# Patient Record
Sex: Female | Born: 1935 | Race: White | Hispanic: No
Health system: Southern US, Community
[De-identification: ages and names within clinical notes are randomized; demographics above are authoritative.]

---

## 2004-07-24 ENCOUNTER — Ambulatory Visit: Payer: Self-pay | Admitting: Internal Medicine

## 2005-02-27 ENCOUNTER — Ambulatory Visit: Payer: Self-pay | Admitting: Internal Medicine

## 2006-04-30 ENCOUNTER — Ambulatory Visit: Payer: Self-pay | Admitting: Internal Medicine

## 2007-05-02 ENCOUNTER — Ambulatory Visit: Payer: Self-pay | Admitting: Internal Medicine

## 2007-05-07 ENCOUNTER — Ambulatory Visit: Payer: Self-pay | Admitting: Internal Medicine

## 2007-06-10 ENCOUNTER — Ambulatory Visit: Payer: Self-pay | Admitting: Gastroenterology

## 2007-11-13 ENCOUNTER — Ambulatory Visit: Payer: Self-pay | Admitting: Internal Medicine

## 2007-11-18 ENCOUNTER — Other Ambulatory Visit: Payer: Self-pay

## 2007-11-18 ENCOUNTER — Emergency Department: Payer: Self-pay | Admitting: Emergency Medicine

## 2008-08-23 ENCOUNTER — Ambulatory Visit: Payer: Self-pay | Admitting: Physician Assistant

## 2008-10-06 ENCOUNTER — Ambulatory Visit: Payer: Self-pay | Admitting: Physical Medicine and Rehabilitation

## 2008-11-16 ENCOUNTER — Ambulatory Visit: Payer: Self-pay | Admitting: Internal Medicine

## 2008-11-19 ENCOUNTER — Ambulatory Visit: Payer: Self-pay | Admitting: Internal Medicine

## 2010-10-02 LAB — BASIC METABOLIC PANEL
BUN: 10 mg/dL (ref 6–23)
CO2: 27 mEq/L (ref 19–32)
Calcium: 9.6 mg/dL (ref 8.4–10.5)
Chloride: 94 mEq/L — ABNORMAL LOW (ref 96–112)
Creatinine, Ser: 0.78 mg/dL (ref 0.4–1.2)
GFR calc Af Amer: >60 mL/min (ref >60)
GFR calc non Af Amer: >60 mL/min (ref >60)
Glucose, Bld: 137 mg/dL — ABNORMAL HIGH (ref 70–99)
Potassium: 4.4 mEq/L (ref 3.5–5.1)
Sodium: 129 mEq/L — ABNORMAL LOW (ref 135–145)

## 2010-10-02 LAB — CBC
HCT: 40.6 % (ref 36.0–46.0)
Hemoglobin: 13.8 g/dL (ref 12.0–15.0)
MCH: 30.5 pg (ref 26.0–34.0)
MCHC: 34 g/dL (ref 30.0–36.0)
MCV: 89.6 fL (ref 78.0–100.0)
Platelets: 239 10*3/uL (ref 150–400)
RBC: 4.53 MIL/uL (ref 3.87–5.11)
RDW: 13.3 % (ref 11.5–15.5)
WBC: 9.4 10*3/uL (ref 4.0–10.5)

## 2010-10-02 LAB — SURGICAL PCR SCREEN
MRSA, PCR: NEGATIVE
Staphylococcus aureus: POSITIVE — AB

## 2010-10-05 ENCOUNTER — Inpatient Hospital Stay (HOSPITAL_COMMUNITY)
Admission: RE | Admit: 2010-10-05 | Discharge: 2010-10-06 | Payer: Self-pay | Source: Home / Self Care | Attending: Neurological Surgery | Admitting: Neurological Surgery

## 2010-10-09 LAB — BASIC METABOLIC PANEL
BUN: 14 mg/dL (ref 6–23)
CO2: 29 mEq/L (ref 19–32)
Calcium: 9.9 mg/dL (ref 8.4–10.5)
Chloride: 96 mEq/L (ref 96–112)
Creatinine, Ser: 0.89 mg/dL (ref 0.4–1.2)
GFR calc Af Amer: >60 mL/min (ref >60)
GFR calc non Af Amer: >60 mL/min (ref >60)
Glucose, Bld: 120 mg/dL — ABNORMAL HIGH (ref 70–99)
Potassium: 4.5 mEq/L (ref 3.5–5.1)
Sodium: 136 mEq/L (ref 135–145)

## 2010-11-09 NOTE — Op Note (Signed)
NAMEJANARIA, MCCAMMON NO.:  [id number]  MEDICAL RECORD NO.:  [id number]          PATIENT TYPE:  INP  LOCATION:  3032                         FACILITY:  [facility]  PHYSICIAN:  Stefani Dama, M.D.  DATE OF BIRTH:  [birth year]  DATE OF PROCEDURE:  10/05/2010 DATE OF DISCHARGE:                              OPERATIVE REPORT   PREOPERATIVE DIAGNOSIS:  Lumbar spondylosis and stenosis, L3-4 and L4-5.  POSTOPERATIVE DIAGNOSIS:  Lumbar spondylosis and stenosis, L3-4 and L4- 5.  PROCEDURE:  Bilateral laminotomies, L3-4 and L4-5 with operating microscope, microdissection technique, and foraminotomies for L3 and L4 nerve roots and L5 nerve roots bilaterally.  SURGEON:  Stefani Dama, MD  FIRST ASSISTANT:  Clydene Fake, MD  ANESTHESIA:  General endotracheal.  INDICATIONS:  [name] is an 77 year old individual who has had significant problems with back and bilateral lower extremity pain to the point where she had become essentially immobile and had started to use a wheelchair to get around.  She could walk only very short distances within her house and even this was becoming difficult such that self- care items were becoming difficult.  She was found to have severe spondylitic stenosis at L4-5 and moderately severe stenosis at L3-4. After careful evaluation of her situation, we advised bilateral laminotomies and foraminotomies.  PROCEDURE:  The patient was brought to the operating room supine on the stretcher.  After smooth induction of general endotracheal anesthesia, she was turned prone.  The back was prepped with alcohol and DuraPrep, and draped in sterile fashion.  Midline incision was created in the lower lumbar spine and this was carried down to lumbodorsal fascia, which was opened on either side of the midline.  Spinous processes of L3 and L4 were identified positively and the dissection was carried down to expose the interlaminar spaces at L3-4  and L4-5.  Self-retaining retractor was placed in the wound.  Then, laminotomy was created first on the left side at L4-L5 using a high-speed bur and a 4-mm round bit to remove significant portions of bone and the medial wall of facet. Common dural tube was ultimately able to be identified after removing large portions of severely hypertrophied and redundant yellow ligament. The common dural tube was noted to be severely compressed.  It was gradually decompressed in its lateral extent.  There was a small dural rent on the lateral aspect of the dural tube where it was adherent to the overlying fascial tissue.  The arachnoid was visible, but no spinal fluid leakage was noted.  This area was tamponaded away and then laminotomies were created on the other 3 sites.  On the right side, the L4 nerve root superiorly and the L5 nerve root inferiorly were decompressed removing substantial amount of redundant yellow ligamentous material that appeared to be a cystic mass in the superior aspect of the facet at L4-L5.  The L5 nerve root was easily decompressed and travel out the foramen inferiorly.  At L3-4 on the right side, moderate spinal stenosis was encountered again with substantial amount of redundant yellow ligament in the anterior ligamentous space and this was removed and decompressed  the common dural tube, and the L3 nerve root superiorly and the L4 nerve root inferiorly.  On the left side, then the L3-4 interspace was decompressed by removing the inferior margin lamina out to the medial wall of the facet and the ligamentous material was removed from this area too.  The L4 nerve root was particularly stenotic and it has travel out the foramen.  This was decompressed by performing a generous laminotomy for the L4 nerve root.  L3 nerve root superiorly had ligamentous material trapped in there and there was moderate amount of adherence of the dural tube to some lateral fascial attachments.   These were all decompressed and care was taken to maintain the integrity of the dural tube.  We then returned to the left side at L4-L5.  Here, the dural opening was explored and the laminotomy was widened out removing an even larger portion of the facet joint to get lateral to the dural tear and identify all the aspects of the common dural tube.  When these were secured, I used 6-0 Prolene to individually close the dural tear with figure-of-eight sutures placed in the middle portion and superiorly single sutures being placed to close the dura tightly.  No spinal fluid leakage was had.  In the end, the common dural tube at the L4 nerve root superiorly, the L5 nerve root inferiorly were well decompressed. Hemostasis was achieved meticulously and then the lumbodorsal fascia was closed with #1 Vicryl in interrupted fashion, 2-0 Vicryl in subcutaneous tissues and 3-0 Vicryl subcuticularly.  Dermabond was placed on the skin.  Blood loss for the procedure was estimated at 150 mL.     Stefani Dama, M.D.     Merla Riches  D:  10/05/2010  T:  10/06/2010  Job:  [id number]  Electronically Signed by Barnett Abu M.D. on 11/09/2010 07:54:10 AM

## 2011-12-21 ENCOUNTER — Ambulatory Visit: Payer: Self-pay | Admitting: Otolaryngology

## 2012-01-01 ENCOUNTER — Ambulatory Visit: Payer: Self-pay | Admitting: Otolaryngology

## 2012-01-01 LAB — CREATININE, SERUM
Creatinine: 0.8 mg/dL (ref 0.60–1.30)
EGFR (African American): >60
EGFR (Non-African Amer.): >60

## 2012-01-16 ENCOUNTER — Ambulatory Visit: Payer: Self-pay | Admitting: Otolaryngology

## 2012-01-29 ENCOUNTER — Ambulatory Visit: Payer: Self-pay | Admitting: Oncology

## 2012-01-29 LAB — COMPREHENSIVE METABOLIC PANEL
Albumin: 3.6 g/dL (ref 3.4–5.0)
Alkaline Phosphatase: 495 U/L — ABNORMAL HIGH (ref 50–136)
Anion Gap: 10 (ref 7–16)
BUN: 15 mg/dL (ref 7–18)
Bilirubin,Total: 0.6 mg/dL (ref 0.2–1.0)
Calcium, Total: 9.2 mg/dL (ref 8.5–10.1)
Chloride: 98 mmol/L (ref 98–107)
Co2: 27 mmol/L (ref 21–32)
Creatinine: 0.69 mg/dL (ref 0.60–1.30)
EGFR (African American): >60
EGFR (Non-African Amer.): >60
Glucose: 107 mg/dL — ABNORMAL HIGH (ref 65–99)
Osmolality: 271 (ref 275–301)
Potassium: 3.9 mmol/L (ref 3.5–5.1)
SGOT(AST): 23 U/L (ref 15–37)
SGPT (ALT): 21 U/L
Sodium: 135 mmol/L — ABNORMAL LOW (ref 136–145)
Total Protein: 7.4 g/dL (ref 6.4–8.2)

## 2012-01-29 LAB — CBC CANCER CENTER
Basophil #: 0.1 x10 3/mm (ref 0.0–0.1)
Basophil %: 0.6 %
Eosinophil #: 0.2 x10 3/mm (ref 0.0–0.7)
Eosinophil %: 1.9 %
HCT: 38.7 % (ref 35.0–47.0)
HGB: 12.7 g/dL (ref 12.0–16.0)
Lymphocyte #: 1.7 x10 3/mm (ref 1.0–3.6)
Lymphocyte %: 13.4 %
MCH: 29.1 pg (ref 26.0–34.0)
MCHC: 32.8 g/dL (ref 32.0–36.0)
MCV: 89 fL (ref 80–100)
Monocyte #: 0.7 x10 3/mm (ref 0.2–0.9)
Monocyte %: 5.7 %
Neutrophil #: 10 x10 3/mm — ABNORMAL HIGH (ref 1.4–6.5)
Neutrophil %: 78.4 %
Platelet: 244 x10 3/mm (ref 150–440)
RBC: 4.37 10*6/uL (ref 3.80–5.20)
RDW: 15.2 % — ABNORMAL HIGH (ref 11.5–14.5)
WBC: 12.7 x10 3/mm — ABNORMAL HIGH (ref 3.6–11.0)

## 2012-01-30 LAB — CANCER ANTIGEN 19-9: CA 19-9: 163 U/mL — ABNORMAL HIGH (ref 0–35)

## 2012-01-30 LAB — CANCER ANTIGEN 27.29: CA 27.29: 79.5 U/mL — ABNORMAL HIGH (ref 0.0–38.6)

## 2012-01-30 LAB — CEA: CEA: 5 ng/mL — ABNORMAL HIGH (ref 0.0–4.7)

## 2012-02-16 ENCOUNTER — Ambulatory Visit: Payer: Self-pay | Admitting: Oncology

## 2012-02-26 LAB — CBC CANCER CENTER
Basophil #: 0 x10 3/mm (ref 0.0–0.1)
Basophil %: 0.4 %
Eosinophil #: 0.2 x10 3/mm (ref 0.0–0.7)
Eosinophil %: 2.4 %
HCT: 36.4 % (ref 35.0–47.0)
HGB: 12 g/dL (ref 12.0–16.0)
Lymphocyte #: 1.4 x10 3/mm (ref 1.0–3.6)
Lymphocyte %: 14.4 %
MCH: 29.4 pg (ref 26.0–34.0)
MCHC: 33.1 g/dL (ref 32.0–36.0)
MCV: 89 fL (ref 80–100)
Monocyte #: 0.8 x10 3/mm (ref 0.2–0.9)
Monocyte %: 7.7 %
Neutrophil #: 7.4 x10 3/mm — ABNORMAL HIGH (ref 1.4–6.5)
Neutrophil %: 75.1 %
Platelet: 264 x10 3/mm (ref 150–440)
RBC: 4.09 10*6/uL (ref 3.80–5.20)
RDW: 14.7 % — ABNORMAL HIGH (ref 11.5–14.5)
WBC: 9.9 x10 3/mm (ref 3.6–11.0)

## 2012-02-26 LAB — BASIC METABOLIC PANEL
Anion Gap: 9 (ref 7–16)
BUN: 15 mg/dL (ref 7–18)
Calcium, Total: 9.1 mg/dL (ref 8.5–10.1)
Chloride: 95 mmol/L — ABNORMAL LOW (ref 98–107)
Co2: 27 mmol/L (ref 21–32)
Creatinine: 0.71 mg/dL (ref 0.60–1.30)
EGFR (African American): >60
EGFR (Non-African Amer.): >60
Glucose: 140 mg/dL — ABNORMAL HIGH (ref 65–99)
Osmolality: 266 (ref 275–301)
Potassium: 4.1 mmol/L (ref 3.5–5.1)
Sodium: 131 mmol/L — ABNORMAL LOW (ref 136–145)

## 2012-03-17 ENCOUNTER — Ambulatory Visit: Payer: Self-pay | Admitting: Oncology

## 2012-03-27 LAB — CBC CANCER CENTER
Basophil #: 0.1 x10 3/mm (ref 0.0–0.1)
Basophil %: 1.4 %
Eosinophil #: 1.1 x10 3/mm — ABNORMAL HIGH (ref 0.0–0.7)
Eosinophil %: 19.1 %
HCT: 35.8 % (ref 35.0–47.0)
HGB: 11.8 g/dL — ABNORMAL LOW (ref 12.0–16.0)
Lymphocyte #: 0.8 x10 3/mm — ABNORMAL LOW (ref 1.0–3.6)
Lymphocyte %: 13.2 %
MCH: 29 pg (ref 26.0–34.0)
MCHC: 33.1 g/dL (ref 32.0–36.0)
MCV: 88 fL (ref 80–100)
Monocyte #: 0.8 x10 3/mm (ref 0.2–0.9)
Monocyte %: 13.6 %
Neutrophil #: 3.1 x10 3/mm (ref 1.4–6.5)
Neutrophil %: 52.7 %
Platelet: 328 x10 3/mm (ref 150–440)
RBC: 4.08 10*6/uL (ref 3.80–5.20)
RDW: 14.7 % — ABNORMAL HIGH (ref 11.5–14.5)
WBC: 6 x10 3/mm (ref 3.6–11.0)

## 2012-03-27 LAB — COMPREHENSIVE METABOLIC PANEL
Albumin: 3.1 g/dL — ABNORMAL LOW (ref 3.4–5.0)
Alkaline Phosphatase: 312 U/L — ABNORMAL HIGH (ref 50–136)
Anion Gap: 11 (ref 7–16)
BUN: 14 mg/dL (ref 7–18)
Bilirubin,Total: 0.5 mg/dL (ref 0.2–1.0)
Calcium, Total: 8.2 mg/dL — ABNORMAL LOW (ref 8.5–10.1)
Chloride: 95 mmol/L — ABNORMAL LOW (ref 98–107)
Co2: 25 mmol/L (ref 21–32)
Creatinine: 0.96 mg/dL (ref 0.60–1.30)
EGFR (African American): >60
EGFR (Non-African Amer.): 55 — ABNORMAL LOW
Glucose: 92 mg/dL (ref 65–99)
Osmolality: 263 (ref 275–301)
Potassium: 4 mmol/L (ref 3.5–5.1)
SGOT(AST): 24 U/L (ref 15–37)
SGPT (ALT): 37 U/L
Sodium: 131 mmol/L — ABNORMAL LOW (ref 136–145)
Total Protein: 7 g/dL (ref 6.4–8.2)

## 2012-04-17 ENCOUNTER — Ambulatory Visit: Payer: Self-pay | Admitting: Oncology

## 2012-04-24 LAB — CBC CANCER CENTER
Basophil #: 0 x10 3/mm (ref 0.0–0.1)
Basophil %: 0.2 %
Eosinophil #: 0.7 x10 3/mm (ref 0.0–0.7)
Eosinophil %: 10.6 %
HCT: 37.5 % (ref 35.0–47.0)
HGB: 12.6 g/dL (ref 12.0–16.0)
Lymphocyte #: 1 x10 3/mm (ref 1.0–3.6)
Lymphocyte %: 15.2 %
MCH: 29.6 pg (ref 26.0–34.0)
MCHC: 33.7 g/dL (ref 32.0–36.0)
MCV: 88 fL (ref 80–100)
Monocyte #: 1 x10 3/mm — ABNORMAL HIGH (ref 0.2–0.9)
Monocyte %: 15.5 %
Neutrophil #: 3.8 x10 3/mm (ref 1.4–6.5)
Neutrophil %: 58.5 %
Platelet: 272 x10 3/mm (ref 150–440)
RBC: 4.28 10*6/uL (ref 3.80–5.20)
RDW: 15.9 % — ABNORMAL HIGH (ref 11.5–14.5)
WBC: 6.5 x10 3/mm (ref 3.6–11.0)

## 2012-04-24 LAB — COMPREHENSIVE METABOLIC PANEL
Albumin: 3 g/dL — ABNORMAL LOW (ref 3.4–5.0)
Alkaline Phosphatase: 249 U/L — ABNORMAL HIGH (ref 50–136)
Anion Gap: 7 (ref 7–16)
BUN: 16 mg/dL (ref 7–18)
Bilirubin,Total: 0.7 mg/dL (ref 0.2–1.0)
Calcium, Total: 7.5 mg/dL — ABNORMAL LOW (ref 8.5–10.1)
Chloride: 95 mmol/L — ABNORMAL LOW (ref 98–107)
Co2: 27 mmol/L (ref 21–32)
Creatinine: 1.08 mg/dL (ref 0.60–1.30)
EGFR (African American): 55 — ABNORMAL LOW
EGFR (Non-African Amer.): 47 — ABNORMAL LOW
Glucose: 95 mg/dL (ref 65–99)
Osmolality: 260 (ref 275–301)
Potassium: 3.8 mmol/L (ref 3.5–5.1)
SGOT(AST): 511 U/L — ABNORMAL HIGH (ref 15–37)
SGPT (ALT): 1262 U/L — ABNORMAL HIGH (ref 12–78)
Sodium: 129 mmol/L — ABNORMAL LOW (ref 136–145)
Total Protein: 6.3 g/dL — ABNORMAL LOW (ref 6.4–8.2)

## 2012-05-18 ENCOUNTER — Ambulatory Visit: Payer: Self-pay | Admitting: Oncology

## 2012-05-20 ENCOUNTER — Ambulatory Visit: Payer: Self-pay | Admitting: Oncology

## 2012-05-22 ENCOUNTER — Ambulatory Visit: Payer: Self-pay | Admitting: Oncology

## 2012-05-22 LAB — CBC CANCER CENTER
Basophil #: 0.1 x10 3/mm (ref 0.0–0.1)
Basophil %: 1.1 %
Eosinophil #: 0.4 x10 3/mm (ref 0.0–0.7)
Eosinophil %: 9 %
HCT: 37.8 % (ref 35.0–47.0)
HGB: 12.2 g/dL (ref 12.0–16.0)
Lymphocyte #: 1.6 x10 3/mm (ref 1.0–3.6)
Lymphocyte %: 35.3 %
MCH: 29 pg (ref 26.0–34.0)
MCHC: 32.4 g/dL (ref 32.0–36.0)
MCV: 90 fL (ref 80–100)
Monocyte #: 0.9 x10 3/mm (ref 0.2–0.9)
Monocyte %: 19.6 %
Neutrophil #: 1.6 x10 3/mm (ref 1.4–6.5)
Neutrophil %: 35 %
Platelet: 184 x10 3/mm (ref 150–440)
RBC: 4.22 10*6/uL (ref 3.80–5.20)
RDW: 17.8 % — ABNORMAL HIGH (ref 11.5–14.5)
WBC: 4.6 x10 3/mm (ref 3.6–11.0)

## 2012-05-22 LAB — COMPREHENSIVE METABOLIC PANEL
Albumin: 2.9 g/dL — ABNORMAL LOW (ref 3.4–5.0)
Alkaline Phosphatase: 194 U/L — ABNORMAL HIGH (ref 50–136)
Anion Gap: 3 — ABNORMAL LOW (ref 7–16)
BUN: 11 mg/dL (ref 7–18)
Bilirubin,Total: 0.7 mg/dL (ref 0.2–1.0)
Calcium, Total: 7.8 mg/dL — ABNORMAL LOW (ref 8.5–10.1)
Chloride: 97 mmol/L — ABNORMAL LOW (ref 98–107)
Co2: 30 mmol/L (ref 21–32)
Creatinine: 0.96 mg/dL (ref 0.60–1.30)
EGFR (African American): >60
EGFR (Non-African Amer.): 54 — ABNORMAL LOW
Glucose: 79 mg/dL (ref 65–99)
Osmolality: 259 (ref 275–301)
Potassium: 4 mmol/L (ref 3.5–5.1)
SGOT(AST): 69 U/L — ABNORMAL HIGH (ref 15–37)
SGPT (ALT): 159 U/L — ABNORMAL HIGH (ref 12–78)
Sodium: 130 mmol/L — ABNORMAL LOW (ref 136–145)
Total Protein: 6.2 g/dL — ABNORMAL LOW (ref 6.4–8.2)

## 2012-06-17 ENCOUNTER — Ambulatory Visit: Payer: Self-pay | Admitting: Oncology

## 2012-06-19 LAB — BASIC METABOLIC PANEL
Anion Gap: 7 (ref 7–16)
BUN: 18 mg/dL (ref 7–18)
Calcium, Total: 7.8 mg/dL — ABNORMAL LOW (ref 8.5–10.1)
Chloride: 101 mmol/L (ref 98–107)
Co2: 26 mmol/L (ref 21–32)
Creatinine: 1.02 mg/dL (ref 0.60–1.30)
EGFR (African American): 59 — ABNORMAL LOW
EGFR (Non-African Amer.): 50 — ABNORMAL LOW
Glucose: 97 mg/dL (ref 65–99)
Osmolality: 270 (ref 275–301)
Potassium: 4.3 mmol/L (ref 3.5–5.1)
Sodium: 134 mmol/L — ABNORMAL LOW (ref 136–145)

## 2012-06-19 LAB — CBC CANCER CENTER
Basophil #: 0 x10 3/mm (ref 0.0–0.1)
Basophil %: 0.9 %
Eosinophil #: 0.3 x10 3/mm (ref 0.0–0.7)
Eosinophil %: 7.7 %
HCT: 38.4 % (ref 35.0–47.0)
HGB: 12.5 g/dL (ref 12.0–16.0)
Lymphocyte #: 1.3 x10 3/mm (ref 1.0–3.6)
Lymphocyte %: 31.4 %
MCH: 29.9 pg (ref 26.0–34.0)
MCHC: 32.6 g/dL (ref 32.0–36.0)
MCV: 92 fL (ref 80–100)
Monocyte #: 0.8 x10 3/mm (ref 0.2–0.9)
Monocyte %: 19 %
Neutrophil #: 1.7 x10 3/mm (ref 1.4–6.5)
Neutrophil %: 41 %
Platelet: 167 x10 3/mm (ref 150–440)
RBC: 4.19 10*6/uL (ref 3.80–5.20)
RDW: 19.4 % — ABNORMAL HIGH (ref 11.5–14.5)
WBC: 4.2 x10 3/mm (ref 3.6–11.0)

## 2012-07-17 LAB — COMPREHENSIVE METABOLIC PANEL
Albumin: 2.6 g/dL — ABNORMAL LOW (ref 3.4–5.0)
Alkaline Phosphatase: 115 U/L (ref 50–136)
Anion Gap: 9 (ref 7–16)
BUN: 13 mg/dL (ref 7–18)
Bilirubin,Total: 0.7 mg/dL (ref 0.2–1.0)
Calcium, Total: 7.6 mg/dL — ABNORMAL LOW (ref 8.5–10.1)
Chloride: 98 mmol/L (ref 98–107)
Co2: 25 mmol/L (ref 21–32)
Creatinine: 0.97 mg/dL (ref 0.60–1.30)
EGFR (African American): >60
EGFR (Non-African Amer.): 54 — ABNORMAL LOW
Glucose: 95 mg/dL (ref 65–99)
Osmolality: 264 (ref 275–301)
Potassium: 3.5 mmol/L (ref 3.5–5.1)
SGOT(AST): 43 U/L — ABNORMAL HIGH (ref 15–37)
SGPT (ALT): 58 U/L (ref 12–78)
Sodium: 132 mmol/L — ABNORMAL LOW (ref 136–145)
Total Protein: 5.6 g/dL — ABNORMAL LOW (ref 6.4–8.2)

## 2012-07-17 LAB — CBC CANCER CENTER
Basophil #: 0 x10 3/mm (ref 0.0–0.1)
Basophil %: 0.3 %
Eosinophil #: 0.3 x10 3/mm (ref 0.0–0.7)
Eosinophil %: 7.2 %
HCT: 37.4 % (ref 35.0–47.0)
HGB: 12.4 g/dL (ref 12.0–16.0)
Lymphocyte #: 1.4 x10 3/mm (ref 1.0–3.6)
Lymphocyte %: 33.4 %
MCH: 31.3 pg (ref 26.0–34.0)
MCHC: 33.2 g/dL (ref 32.0–36.0)
MCV: 94 fL (ref 80–100)
Monocyte #: 0.8 x10 3/mm (ref 0.2–0.9)
Monocyte %: 18.9 %
Neutrophil #: 1.7 x10 3/mm (ref 1.4–6.5)
Neutrophil %: 40.2 %
Platelet: 142 x10 3/mm — ABNORMAL LOW (ref 150–440)
RBC: 3.96 10*6/uL (ref 3.80–5.20)
RDW: 17.9 % — ABNORMAL HIGH (ref 11.5–14.5)
WBC: 4.2 x10 3/mm (ref 3.6–11.0)

## 2012-07-18 ENCOUNTER — Ambulatory Visit: Payer: Self-pay | Admitting: Oncology

## 2012-08-13 LAB — CBC CANCER CENTER
Basophil #: 0 x10 3/mm (ref 0.0–0.1)
Basophil %: 0.9 %
Eosinophil #: 0.2 x10 3/mm (ref 0.0–0.7)
Eosinophil %: 5.7 %
HCT: 37.1 % (ref 35.0–47.0)
HGB: 12.5 g/dL (ref 12.0–16.0)
Lymphocyte #: 1.3 x10 3/mm (ref 1.0–3.6)
Lymphocyte %: 30.9 %
MCH: 32.1 pg (ref 26.0–34.0)
MCHC: 33.7 g/dL (ref 32.0–36.0)
MCV: 95 fL (ref 80–100)
Monocyte #: 0.5 x10 3/mm (ref 0.2–0.9)
Monocyte %: 12.4 %
Neutrophil #: 2.1 x10 3/mm (ref 1.4–6.5)
Neutrophil %: 50.1 %
Platelet: 156 x10 3/mm (ref 150–440)
RBC: 3.89 10*6/uL (ref 3.80–5.20)
RDW: 14.8 % — ABNORMAL HIGH (ref 11.5–14.5)
WBC: 4.3 x10 3/mm (ref 3.6–11.0)

## 2012-08-13 LAB — COMPREHENSIVE METABOLIC PANEL
Albumin: 2.8 g/dL — ABNORMAL LOW (ref 3.4–5.0)
Alkaline Phosphatase: 116 U/L (ref 50–136)
Anion Gap: 8 (ref 7–16)
BUN: 14 mg/dL (ref 7–18)
Bilirubin,Total: 0.7 mg/dL (ref 0.2–1.0)
Calcium, Total: 7.7 mg/dL — ABNORMAL LOW (ref 8.5–10.1)
Chloride: 98 mmol/L (ref 98–107)
Co2: 26 mmol/L (ref 21–32)
Creatinine: 1.04 mg/dL (ref 0.60–1.30)
EGFR (African American): 57 — ABNORMAL LOW
EGFR (Non-African Amer.): 49 — ABNORMAL LOW
Glucose: 117 mg/dL — ABNORMAL HIGH (ref 65–99)
Osmolality: 266 (ref 275–301)
Potassium: 3.5 mmol/L (ref 3.5–5.1)
SGOT(AST): 39 U/L — ABNORMAL HIGH (ref 15–37)
SGPT (ALT): 54 U/L (ref 12–78)
Sodium: 132 mmol/L — ABNORMAL LOW (ref 136–145)
Total Protein: 5.6 g/dL — ABNORMAL LOW (ref 6.4–8.2)

## 2012-08-17 ENCOUNTER — Ambulatory Visit: Payer: Self-pay | Admitting: Oncology

## 2012-09-18 ENCOUNTER — Ambulatory Visit: Payer: Self-pay | Admitting: Oncology

## 2012-09-18 LAB — COMPREHENSIVE METABOLIC PANEL
Albumin: 2.8 g/dL — ABNORMAL LOW (ref 3.4–5.0)
Alkaline Phosphatase: 134 U/L (ref 50–136)
Anion Gap: 6 — ABNORMAL LOW (ref 7–16)
BUN: 15 mg/dL (ref 7–18)
Bilirubin,Total: 0.6 mg/dL (ref 0.2–1.0)
Calcium, Total: 8.2 mg/dL — ABNORMAL LOW (ref 8.5–10.1)
Chloride: 99 mmol/L (ref 98–107)
Co2: 27 mmol/L (ref 21–32)
Creatinine: 0.96 mg/dL (ref 0.60–1.30)
EGFR (African American): >60
EGFR (Non-African Amer.): 54 — ABNORMAL LOW
Glucose: 106 mg/dL — ABNORMAL HIGH (ref 65–99)
Osmolality: 266 (ref 275–301)
Potassium: 4.2 mmol/L (ref 3.5–5.1)
SGOT(AST): 35 U/L (ref 15–37)
SGPT (ALT): 48 U/L (ref 12–78)
Sodium: 132 mmol/L — ABNORMAL LOW (ref 136–145)
Total Protein: 6.1 g/dL — ABNORMAL LOW (ref 6.4–8.2)

## 2012-09-18 LAB — CBC CANCER CENTER
Basophil #: 0 x10 3/mm (ref 0.0–0.1)
Basophil %: 0.8 %
Eosinophil #: 0.3 x10 3/mm (ref 0.0–0.7)
Eosinophil %: 5.2 %
HCT: 37.2 % (ref 35.0–47.0)
HGB: 12.7 g/dL (ref 12.0–16.0)
Lymphocyte #: 1.4 x10 3/mm (ref 1.0–3.6)
Lymphocyte %: 27.1 %
MCH: 33.4 pg (ref 26.0–34.0)
MCHC: 34.1 g/dL (ref 32.0–36.0)
MCV: 98 fL (ref 80–100)
Monocyte #: 0.7 x10 3/mm (ref 0.2–0.9)
Monocyte %: 13.7 %
Neutrophil #: 2.8 x10 3/mm (ref 1.4–6.5)
Neutrophil %: 53.2 %
Platelet: 162 x10 3/mm (ref 150–440)
RBC: 3.79 10*6/uL — ABNORMAL LOW (ref 3.80–5.20)
RDW: 14 % (ref 11.5–14.5)
WBC: 5.3 x10 3/mm (ref 3.6–11.0)

## 2012-10-16 LAB — CBC CANCER CENTER
Basophil #: 0 x10 3/mm (ref 0.0–0.1)
Basophil %: 0.7 %
Eosinophil #: 0.2 x10 3/mm (ref 0.0–0.7)
Eosinophil %: 3.6 %
HCT: 38.2 % (ref 35.0–47.0)
HGB: 13 g/dL (ref 12.0–16.0)
Lymphocyte #: 1.2 x10 3/mm (ref 1.0–3.6)
Lymphocyte %: 22.8 %
MCH: 33.2 pg (ref 26.0–34.0)
MCHC: 34.1 g/dL (ref 32.0–36.0)
MCV: 97 fL (ref 80–100)
Monocyte #: 0.6 x10 3/mm (ref 0.2–0.9)
Monocyte %: 11 %
Neutrophil #: 3.1 x10 3/mm (ref 1.4–6.5)
Neutrophil %: 61.9 %
Platelet: 155 x10 3/mm (ref 150–440)
RBC: 3.92 10*6/uL (ref 3.80–5.20)
RDW: 14.2 % (ref 11.5–14.5)
WBC: 5.1 x10 3/mm (ref 3.6–11.0)

## 2012-10-16 LAB — COMPREHENSIVE METABOLIC PANEL
Albumin: 2.9 g/dL — ABNORMAL LOW (ref 3.4–5.0)
Alkaline Phosphatase: 120 U/L (ref 50–136)
Anion Gap: 7 (ref 7–16)
BUN: 13 mg/dL (ref 7–18)
Bilirubin,Total: 0.5 mg/dL (ref 0.2–1.0)
Calcium, Total: 7.9 mg/dL — ABNORMAL LOW (ref 8.5–10.1)
Chloride: 98 mmol/L (ref 98–107)
Co2: 29 mmol/L (ref 21–32)
Creatinine: 1.02 mg/dL (ref 0.60–1.30)
EGFR (African American): 59 — ABNORMAL LOW
EGFR (Non-African Amer.): 50 — ABNORMAL LOW
Glucose: 109 mg/dL — ABNORMAL HIGH (ref 65–99)
Osmolality: 269 (ref 275–301)
Potassium: 3.9 mmol/L (ref 3.5–5.1)
SGOT(AST): 33 U/L (ref 15–37)
SGPT (ALT): 44 U/L (ref 12–78)
Sodium: 134 mmol/L — ABNORMAL LOW (ref 136–145)
Total Protein: 5.9 g/dL — ABNORMAL LOW (ref 6.4–8.2)

## 2012-10-18 ENCOUNTER — Ambulatory Visit: Payer: Self-pay | Admitting: Oncology

## 2012-11-15 ENCOUNTER — Ambulatory Visit: Payer: Self-pay | Admitting: Oncology

## 2012-11-20 LAB — COMPREHENSIVE METABOLIC PANEL
Albumin: 3 g/dL — ABNORMAL LOW (ref 3.4–5.0)
Alkaline Phosphatase: 118 U/L (ref 50–136)
Anion Gap: 9 (ref 7–16)
BUN: 14 mg/dL (ref 7–18)
Bilirubin,Total: 0.7 mg/dL (ref 0.2–1.0)
Calcium, Total: 8.2 mg/dL — ABNORMAL LOW (ref 8.5–10.1)
Chloride: 95 mmol/L — ABNORMAL LOW (ref 98–107)
Co2: 29 mmol/L (ref 21–32)
Creatinine: 1.14 mg/dL (ref 0.60–1.30)
EGFR (African American): 51 — ABNORMAL LOW
EGFR (Non-African Amer.): 44 — ABNORMAL LOW
Glucose: 104 mg/dL — ABNORMAL HIGH (ref 65–99)
Osmolality: 267 (ref 275–301)
Potassium: 3.7 mmol/L (ref 3.5–5.1)
SGOT(AST): 34 U/L (ref 15–37)
SGPT (ALT): 40 U/L (ref 12–78)
Sodium: 133 mmol/L — ABNORMAL LOW (ref 136–145)
Total Protein: 6.2 g/dL — ABNORMAL LOW (ref 6.4–8.2)

## 2012-11-20 LAB — CBC CANCER CENTER
Basophil #: 0.1 x10 3/mm (ref 0.0–0.1)
Basophil %: 0.9 %
Eosinophil #: 0.3 x10 3/mm (ref 0.0–0.7)
Eosinophil %: 4.8 %
HCT: 39.3 % (ref 35.0–47.0)
HGB: 13.3 g/dL (ref 12.0–16.0)
Lymphocyte #: 1.6 x10 3/mm (ref 1.0–3.6)
Lymphocyte %: 25.4 %
MCH: 32.7 pg (ref 26.0–34.0)
MCHC: 33.9 g/dL (ref 32.0–36.0)
MCV: 97 fL (ref 80–100)
Monocyte #: 0.7 x10 3/mm (ref 0.2–0.9)
Monocyte %: 10.8 %
Neutrophil #: 3.8 x10 3/mm (ref 1.4–6.5)
Neutrophil %: 58.1 %
Platelet: 156 x10 3/mm (ref 150–440)
RBC: 4.07 10*6/uL (ref 3.80–5.20)
RDW: 13.7 % (ref 11.5–14.5)
WBC: 6.5 x10 3/mm (ref 3.6–11.0)

## 2012-12-16 ENCOUNTER — Ambulatory Visit: Payer: Self-pay | Admitting: Oncology

## 2012-12-18 LAB — CBC CANCER CENTER
Basophil #: 0 x10 3/mm (ref 0.0–0.1)
Basophil %: 0.8 %
Eosinophil #: 0.2 x10 3/mm (ref 0.0–0.7)
Eosinophil %: 6.1 %
HCT: 38.2 % (ref 35.0–47.0)
HGB: 12.7 g/dL (ref 12.0–16.0)
Lymphocyte #: 0.8 x10 3/mm — ABNORMAL LOW (ref 1.0–3.6)
Lymphocyte %: 21.2 %
MCH: 31.9 pg (ref 26.0–34.0)
MCHC: 33.3 g/dL (ref 32.0–36.0)
MCV: 96 fL (ref 80–100)
Monocyte #: 0.6 x10 3/mm (ref 0.2–0.9)
Monocyte %: 14.5 %
Neutrophil #: 2.3 x10 3/mm (ref 1.4–6.5)
Neutrophil %: 57.4 %
Platelet: 167 x10 3/mm (ref 150–440)
RBC: 3.98 10*6/uL (ref 3.80–5.20)
RDW: 13.9 % (ref 11.5–14.5)
WBC: 3.9 x10 3/mm (ref 3.6–11.0)

## 2012-12-18 LAB — COMPREHENSIVE METABOLIC PANEL
Albumin: 2.8 g/dL — ABNORMAL LOW (ref 3.4–5.0)
Alkaline Phosphatase: 107 U/L (ref 50–136)
Anion Gap: 8 (ref 7–16)
BUN: 11 mg/dL (ref 7–18)
Bilirubin,Total: 0.5 mg/dL (ref 0.2–1.0)
Calcium, Total: 8.3 mg/dL — ABNORMAL LOW (ref 8.5–10.1)
Chloride: 96 mmol/L — ABNORMAL LOW (ref 98–107)
Co2: 29 mmol/L (ref 21–32)
Creatinine: 1.01 mg/dL (ref 0.60–1.30)
EGFR (African American): 59 — ABNORMAL LOW
EGFR (Non-African Amer.): 51 — ABNORMAL LOW
Glucose: 85 mg/dL (ref 65–99)
Osmolality: 265 (ref 275–301)
Potassium: 3.8 mmol/L (ref 3.5–5.1)
SGOT(AST): 28 U/L (ref 15–37)
SGPT (ALT): 37 U/L (ref 12–78)
Sodium: 133 mmol/L — ABNORMAL LOW (ref 136–145)
Total Protein: 6.1 g/dL — ABNORMAL LOW (ref 6.4–8.2)

## 2012-12-29 LAB — COMPREHENSIVE METABOLIC PANEL
Albumin: 2.8 g/dL — ABNORMAL LOW (ref 3.4–5.0)
Alkaline Phosphatase: 117 U/L (ref 50–136)
Anion Gap: 4 — ABNORMAL LOW (ref 7–16)
BUN: 12 mg/dL (ref 7–18)
Bilirubin,Total: 0.6 mg/dL (ref 0.2–1.0)
Calcium, Total: 8.1 mg/dL — ABNORMAL LOW (ref 8.5–10.1)
Chloride: 88 mmol/L — ABNORMAL LOW (ref 98–107)
Co2: 30 mmol/L (ref 21–32)
Creatinine: 1.05 mg/dL (ref 0.60–1.30)
EGFR (African American): 56 — ABNORMAL LOW
EGFR (Non-African Amer.): 49 — ABNORMAL LOW
Glucose: 133 mg/dL — ABNORMAL HIGH (ref 65–99)
Osmolality: 248 (ref 275–301)
Potassium: 4.1 mmol/L (ref 3.5–5.1)
SGOT(AST): 38 U/L — ABNORMAL HIGH (ref 15–37)
SGPT (ALT): 37 U/L (ref 12–78)
Sodium: 122 mmol/L — ABNORMAL LOW (ref 136–145)
Total Protein: 5.7 g/dL — ABNORMAL LOW (ref 6.4–8.2)

## 2012-12-29 LAB — CBC CANCER CENTER
Basophil #: 0 x10 3/mm (ref 0.0–0.1)
Basophil %: 0.5 %
Eosinophil #: 0.2 x10 3/mm (ref 0.0–0.7)
Eosinophil %: 2.1 %
HCT: 35.6 % (ref 35.0–47.0)
HGB: 12.3 g/dL (ref 12.0–16.0)
Lymphocyte #: 0.8 x10 3/mm — ABNORMAL LOW (ref 1.0–3.6)
Lymphocyte %: 8.4 %
MCH: 32.4 pg (ref 26.0–34.0)
MCHC: 34.5 g/dL (ref 32.0–36.0)
MCV: 94 fL (ref 80–100)
Monocyte #: 0.6 x10 3/mm (ref 0.2–0.9)
Monocyte %: 6.9 %
Neutrophil #: 7.5 x10 3/mm — ABNORMAL HIGH (ref 1.4–6.5)
Neutrophil %: 82.1 %
Platelet: 215 x10 3/mm (ref 150–440)
RBC: 3.78 10*6/uL — ABNORMAL LOW (ref 3.80–5.20)
RDW: 14.1 % (ref 11.5–14.5)
WBC: 9.1 x10 3/mm (ref 3.6–11.0)

## 2013-01-12 ENCOUNTER — Ambulatory Visit: Payer: Self-pay | Admitting: Oncology

## 2013-01-15 ENCOUNTER — Ambulatory Visit: Payer: Self-pay | Admitting: Oncology

## 2013-01-15 LAB — MAGNESIUM: Magnesium: 1.8 mg/dL

## 2013-01-15 LAB — CBC CANCER CENTER
Basophil #: 0 x10 3/mm (ref 0.0–0.1)
Basophil %: 0.2 %
Eosinophil #: 0.2 x10 3/mm (ref 0.0–0.7)
Eosinophil %: 4.3 %
HCT: 39.1 % (ref 35.0–47.0)
HGB: 12.9 g/dL (ref 12.0–16.0)
Lymphocyte #: 1.1 x10 3/mm (ref 1.0–3.6)
Lymphocyte %: 20.4 %
MCH: 31.3 pg (ref 26.0–34.0)
MCHC: 33 g/dL (ref 32.0–36.0)
MCV: 95 fL (ref 80–100)
Monocyte #: 0.7 x10 3/mm (ref 0.2–0.9)
Monocyte %: 13.2 %
Neutrophil #: 3.2 x10 3/mm (ref 1.4–6.5)
Neutrophil %: 61.9 %
Platelet: 224 x10 3/mm (ref 150–440)
RBC: 4.12 10*6/uL (ref 3.80–5.20)
RDW: 14.3 % (ref 11.5–14.5)
WBC: 5.2 x10 3/mm (ref 3.6–11.0)

## 2013-01-15 LAB — COMPREHENSIVE METABOLIC PANEL
Albumin: 2.7 g/dL — ABNORMAL LOW (ref 3.4–5.0)
Alkaline Phosphatase: 170 U/L — ABNORMAL HIGH (ref 50–136)
Anion Gap: 9 (ref 7–16)
BUN: 14 mg/dL (ref 7–18)
Bilirubin,Total: 0.8 mg/dL (ref 0.2–1.0)
Calcium, Total: 8.4 mg/dL — ABNORMAL LOW (ref 8.5–10.1)
Chloride: 95 mmol/L — ABNORMAL LOW (ref 98–107)
Co2: 28 mmol/L (ref 21–32)
Creatinine: 1.12 mg/dL (ref 0.60–1.30)
EGFR (African American): 52 — ABNORMAL LOW
EGFR (Non-African Amer.): 45 — ABNORMAL LOW
Glucose: 107 mg/dL — ABNORMAL HIGH (ref 65–99)
Osmolality: 265 (ref 275–301)
Potassium: 3.9 mmol/L (ref 3.5–5.1)
SGOT(AST): 84 U/L — ABNORMAL HIGH (ref 15–37)
SGPT (ALT): 99 U/L — ABNORMAL HIGH (ref 12–78)
Sodium: 132 mmol/L — ABNORMAL LOW (ref 136–145)
Total Protein: 6 g/dL — ABNORMAL LOW (ref 6.4–8.2)

## 2013-01-26 ENCOUNTER — Inpatient Hospital Stay: Payer: Self-pay | Admitting: Oncology

## 2013-01-26 LAB — COMPREHENSIVE METABOLIC PANEL
Albumin: 2.4 g/dL — ABNORMAL LOW (ref 3.4–5.0)
Alkaline Phosphatase: 332 U/L — ABNORMAL HIGH (ref 50–136)
Anion Gap: 10 (ref 7–16)
BUN: 17 mg/dL (ref 7–18)
Bilirubin,Total: 16.8 mg/dL — ABNORMAL HIGH (ref 0.2–1.0)
Calcium, Total: 7.9 mg/dL — ABNORMAL LOW (ref 8.5–10.1)
Chloride: 72 mmol/L — ABNORMAL LOW (ref 98–107)
Co2: 24 mmol/L (ref 21–32)
Creatinine: 0.67 mg/dL (ref 0.60–1.30)
EGFR (African American): >60
EGFR (Non-African Amer.): >60
Glucose: 107 mg/dL — ABNORMAL HIGH (ref 65–99)
Osmolality: 218 (ref 275–301)
Potassium: 4.1 mmol/L (ref 3.5–5.1)
SGOT(AST): 79 U/L — ABNORMAL HIGH (ref 15–37)
SGPT (ALT): 139 U/L — ABNORMAL HIGH (ref 12–78)
Sodium: 106 mmol/L — CL (ref 136–145)
Total Protein: 5.5 g/dL — ABNORMAL LOW (ref 6.4–8.2)

## 2013-01-26 LAB — CBC CANCER CENTER
Basophil #: 0 x10 3/mm (ref 0.0–0.1)
Basophil %: 0.2 %
Eosinophil #: 0 x10 3/mm (ref 0.0–0.7)
Eosinophil %: 0.4 %
HCT: 35.5 % (ref 35.0–47.0)
HGB: 12.5 g/dL (ref 12.0–16.0)
Lymphocyte #: 0.5 x10 3/mm — ABNORMAL LOW (ref 1.0–3.6)
Lymphocyte %: 4.4 %
MCH: 32.3 pg (ref 26.0–34.0)
MCHC: 35.3 g/dL (ref 32.0–36.0)
MCV: 92 fL (ref 80–100)
Monocyte #: 1 x10 3/mm — ABNORMAL HIGH (ref 0.2–0.9)
Monocyte %: 9 %
Neutrophil #: 9.6 x10 3/mm — ABNORMAL HIGH (ref 1.4–6.5)
Neutrophil %: 86 %
Platelet: 208 x10 3/mm (ref 150–440)
RBC: 3.88 10*6/uL (ref 3.80–5.20)
RDW: 14.4 % (ref 11.5–14.5)
WBC: 11.1 x10 3/mm — ABNORMAL HIGH (ref 3.6–11.0)

## 2013-01-26 LAB — URINALYSIS, COMPLETE
Bacteria: NONE SEEN
Glucose,UR: NEGATIVE mg/dL (ref 0–75)
Hyaline Cast: 5
Ketone: NEGATIVE
Nitrite: NEGATIVE
Ph: 5 (ref 4.5–8.0)
Protein: 30
RBC,UR: 3 /HPF (ref 0–5)
Specific Gravity: 1.02 (ref 1.003–1.030)
Squamous Epithelial: 2
WBC UR: 4 /HPF (ref 0–5)

## 2013-01-26 LAB — LACTATE DEHYDROGENASE: LDH: 306 U/L — ABNORMAL HIGH (ref 81–246)

## 2013-01-27 LAB — CBC WITH DIFFERENTIAL/PLATELET
Basophil #: 0 10*3/uL (ref 0.0–0.1)
Basophil %: 0.4 %
Eosinophil #: 0.1 10*3/uL (ref 0.0–0.7)
Eosinophil %: 1.1 %
HCT: 31.2 % — ABNORMAL LOW (ref 35.0–47.0)
HGB: 11.3 g/dL — ABNORMAL LOW (ref 12.0–16.0)
Lymphocyte #: 0.5 10*3/uL — ABNORMAL LOW (ref 1.0–3.6)
Lymphocyte %: 5.7 %
MCH: 32.9 pg (ref 26.0–34.0)
MCHC: 36.4 g/dL — ABNORMAL HIGH (ref 32.0–36.0)
MCV: 91 fL (ref 80–100)
Monocyte #: 0.8 x10 3/mm (ref 0.2–0.9)
Monocyte %: 9.2 %
Neutrophil #: 7.1 10*3/uL — ABNORMAL HIGH (ref 1.4–6.5)
Neutrophil %: 83.6 %
Platelet: 153 10*3/uL (ref 150–440)
RBC: 3.44 10*6/uL — ABNORMAL LOW (ref 3.80–5.20)
RDW: 14.4 % (ref 11.5–14.5)
WBC: 8.5 10*3/uL (ref 3.6–11.0)

## 2013-01-27 LAB — COMPREHENSIVE METABOLIC PANEL
Albumin: 2 g/dL — ABNORMAL LOW (ref 3.4–5.0)
BUN: 15 mg/dL (ref 7–18)
Bilirubin,Total: 16.2 mg/dL — ABNORMAL HIGH (ref 0.2–1.0)
Calcium, Total: 7.1 mg/dL — ABNORMAL LOW (ref 8.5–10.1)
Chloride: 75 mmol/L — ABNORMAL LOW (ref 98–107)
Co2: 27 mmol/L (ref 21–32)
EGFR (Non-African Amer.): >60
Glucose: 77 mg/dL (ref 65–99)
Potassium: 3.6 mmol/L (ref 3.5–5.1)
SGOT(AST): 75 U/L — ABNORMAL HIGH (ref 15–37)

## 2013-01-28 LAB — CBC WITH DIFFERENTIAL/PLATELET
Eosinophil #: 0 10*3/uL (ref 0.0–0.7)
HGB: 11.1 g/dL — ABNORMAL LOW (ref 12.0–16.0)
Lymphocyte #: 0.5 10*3/uL — ABNORMAL LOW (ref 1.0–3.6)
Lymphocyte %: 5.8 %
MCH: 33.1 pg (ref 26.0–34.0)
Monocyte #: 0.6 x10 3/mm (ref 0.2–0.9)
Monocyte %: 6.7 %
Neutrophil #: 7.8 10*3/uL — ABNORMAL HIGH (ref 1.4–6.5)
Neutrophil %: 86.5 %
RBC: 3.36 10*6/uL — ABNORMAL LOW (ref 3.80–5.20)

## 2013-01-28 LAB — COMPREHENSIVE METABOLIC PANEL
Alkaline Phosphatase: 353 U/L — ABNORMAL HIGH (ref 50–136)
Anion Gap: 5 — ABNORMAL LOW (ref 7–16)
BUN: 16 mg/dL (ref 7–18)
Calcium, Total: 6.6 mg/dL — CL (ref 8.5–10.1)
Co2: 24 mmol/L (ref 21–32)
Creatinine: 0.61 mg/dL (ref 0.60–1.30)
EGFR (African American): >60
EGFR (Non-African Amer.): >60
Osmolality: 232 (ref 275–301)
Potassium: 3.4 mmol/L — ABNORMAL LOW (ref 3.5–5.1)
SGPT (ALT): 106 U/L — ABNORMAL HIGH (ref 12–78)
Sodium: 114 mmol/L — CL (ref 136–145)
Total Protein: 4.5 g/dL — ABNORMAL LOW (ref 6.4–8.2)

## 2013-01-28 LAB — URINE CULTURE

## 2013-02-15 ENCOUNTER — Ambulatory Visit: Payer: Self-pay | Admitting: Oncology

## 2013-02-15 DEATH — deceased

## 2014-03-04 IMAGING — CT CT NECK WITH CONTRAST
2 series · 10 of 14 positions shown, 12 images · IV contrast (agent unspecified)
Comparison: none

REASON FOR EXAM: US done 12-21-11 internal blood flow in area of rt neck mass
will have labs'42.
COMMENTS:

PROCEDURE:     CT NECK WITH CONTRAST 01/01/2012 [DATE]
RESULT:
TECHNIQUE: Helical 3 mm sections were obtained from the skull base through
the mid mediastinal region status post intravenous administration of 65 mL
of Fsovue-DQQ.

[Series 2: soft tissue · axial · 0.59mm/px · z∈[+268,+542]mm · 8 of 117 slices shown, 10 images]
[im 13/117  soft-tissue]
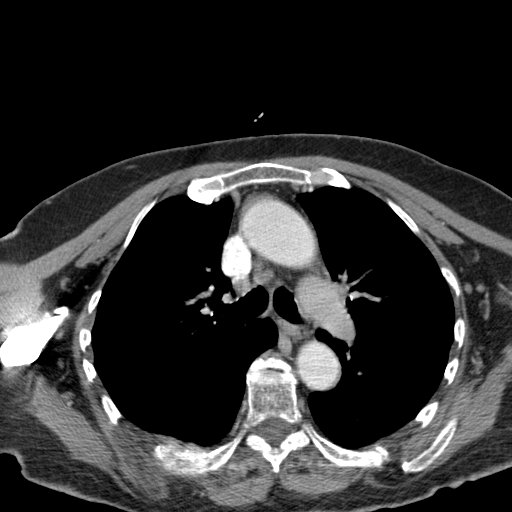
[im 13/117  bone]
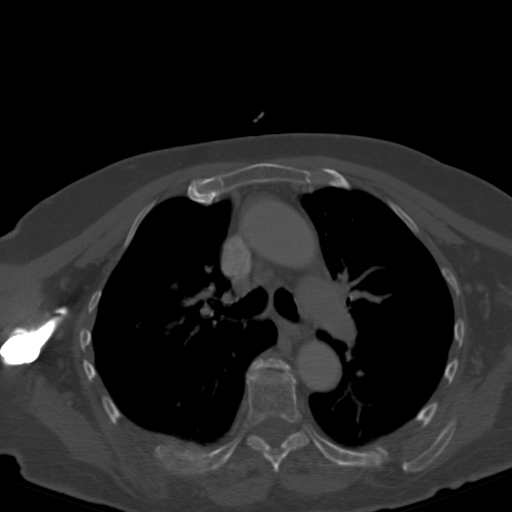
[im 26/117  bone]
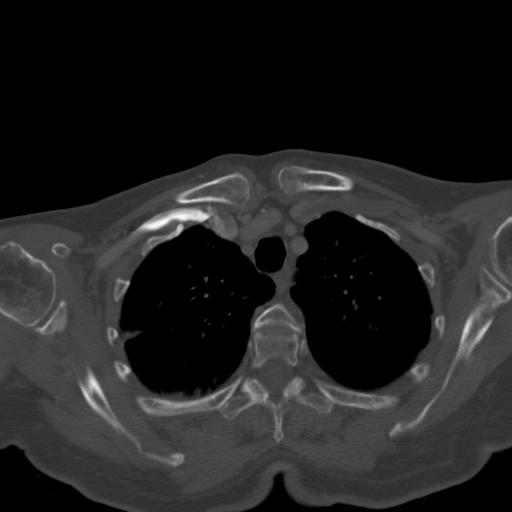
[im 39/117  bone]
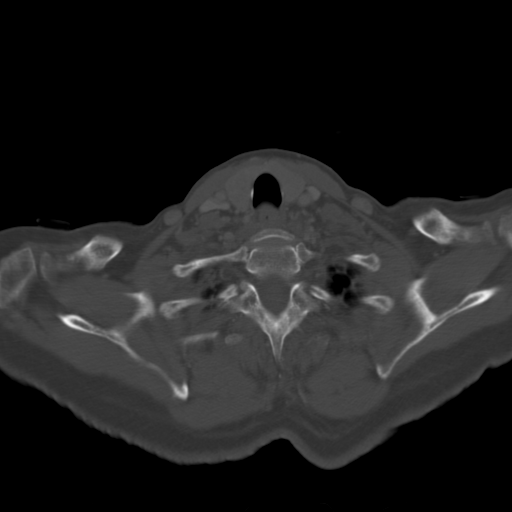
[im 52/117  bone]
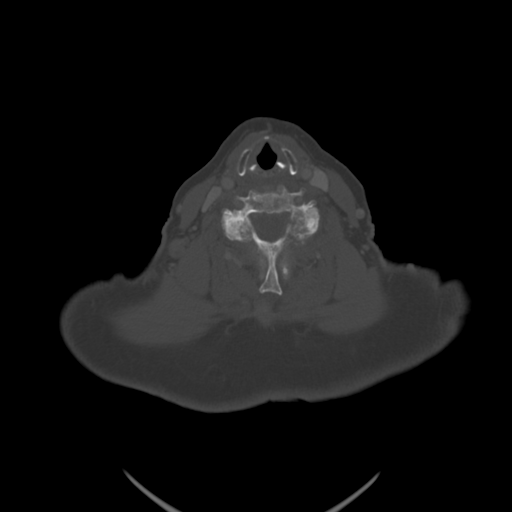
[im 65/117  soft-tissue]
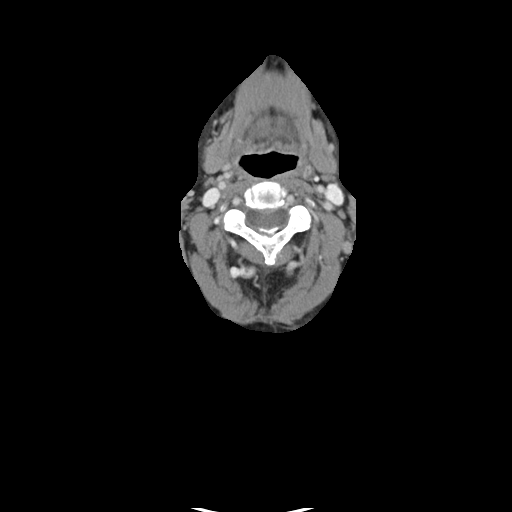
[im 65/117  bone]
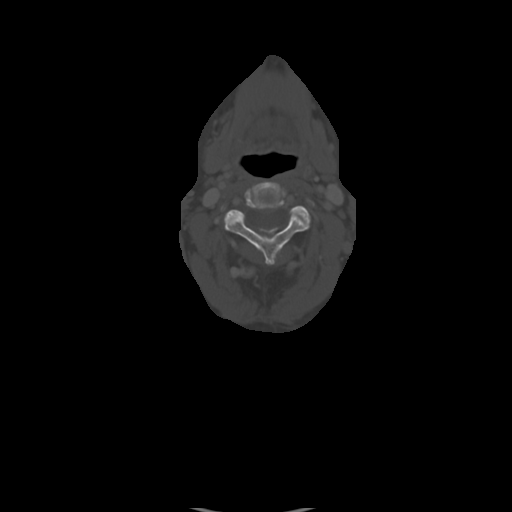
[im 78/117  bone]
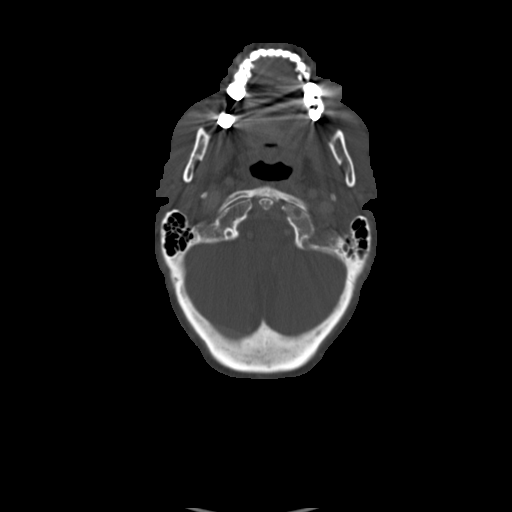
[im 91/117  bone]
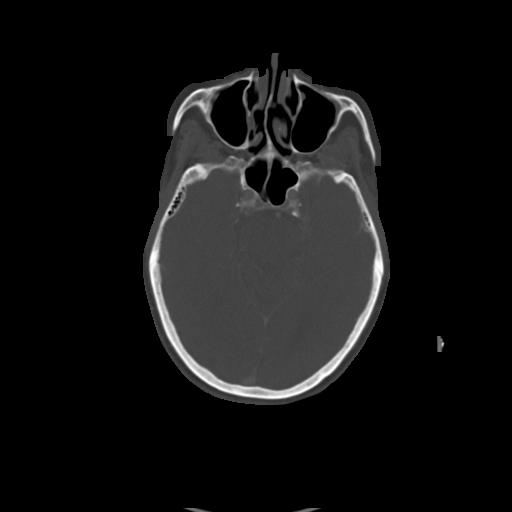
[im 104/117  bone]
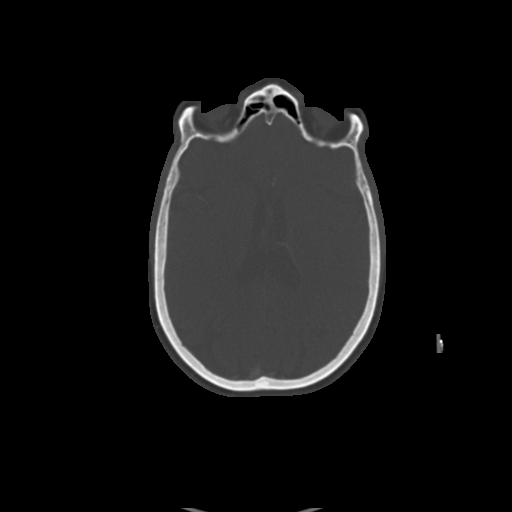

[Series 4: lung windows · axial · 0.66mm/px · z∈[+274,+316]mm · 2 of 44 slices shown]
[im 15/44  bone]
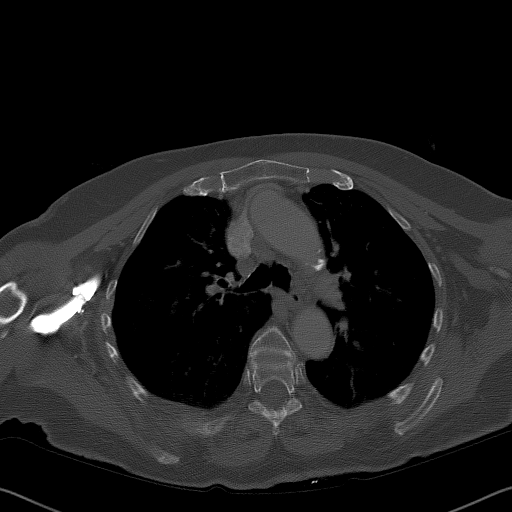
[im 29/44  bone]
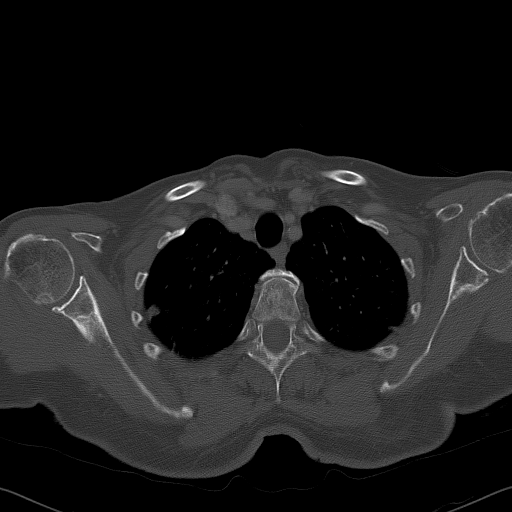

[10 of 14 positions shown; findings below may reference images not displayed]

FINDINGS: Three to 5 cm sized lymph nodes are identified within the anterior
and posterior cervical chains primarily within the carotid space regions.

Along the posterior aspect of the parotid space a 1.31 x 1.17 cm nodule is
appreciated with a central area of low-attenuation. This may represent a
lymph node though a non-nodal mass is a diagnostic consideration. The low
attenuating center is concerning for area of necrosis. A prominent lymph
node is identified along the anterior base of the lateral strap muscle on
the right measured on image #67 demonstrating narrowest dimension of 1.01 cm.

The mediastinum and hilar regions and structures demonstrates a
heterogeneously enhancing mass within the subcarinal region measuring 2.67 x
5.19 cm. Differential considerations are a nodal mass versus non-nodal soft
tissue mass with central low attenuation possibly representing necrosis.
Bilateral hilar lymph nodes are identified which are enlarged. Mild to
moderately enlarged precarinal and pretracheal lymph nodes are appreciated.
These areas measure approximately 1 cm in narrowest dimensions. There are
findings concerning for prominent right hilar lymph nodes. The visualized
upper lung parenchyma demonstrates thickening of the interstitial markings
as well as diffuse ground-glass appearance.
IMPRESSION: 1. Adenopathy within the neck. As described above, there is a necrotic lymph
node versus necrotic non-nodal mass just posterior and inferior to the
parotid space region on the left.
2. Mediastinal mass as described above. Different considerations are primary
neoplastic disease versus metastatic disease in the mediastinum. An etiology
such as lymphoma is of diagnostic consideration. There is mediastinal
adenopathy as well as findings concerning for hilar adenopathy. These
findings again are concerning for consistent with neoplastic disease until
proven otherwise. Note; the previously described mediastinal mass does not
demonstrate appreciable evidence of tracheal or bronchial extension nor
vascular extension or compression at this time.
3. Enlarged lymph node versus non-nodal mass along the posterior base of the
neck on the right. This finding was previously initially evaluated with
ultrasound. The CT findings are consistent with the sonographic findings.
This study was correlated with the ultrasound from 12/21/2011.

## 2014-03-19 IMAGING — PT NM PET TUM IMG INITIAL (PI) SKULL BASE T - THIGH
1 of 5 series · 2 of 25 positions shown · non-contrast
Comparison: none

REASON FOR EXAM: positive FNA  unknown Gaspar Pedro adenoma CA
COMMENTS:

PROCEDURE:     PET - PET/CT INIT STAG HEAD/NECK CA  - January 16, 2012 [DATE]
RESULT:     Indication: Primary adenocarcinoma of unknown origin
Radiopharmaceutical: 12.3 mCi F18-FDG, intravenously.
TECHNIQUE: Imaging was performed from the skull base to the mid-thigh using
routine PET/CT acquisition protocol.
Injection site: Left antecubital
Time of FDG injection: 1111 hours
Serum glucose: 108 mg/dL
Time of imaging: 6489 hour with delayed images at 9985 hours
Comparison studies: NONE

[Series 3: ct headneck 2.0 b31s · axial · 2.0mm · 0.98mm/px · z∈[-560,-520]mm · 2 of 188 slices shown]
[im 134/188  brain]
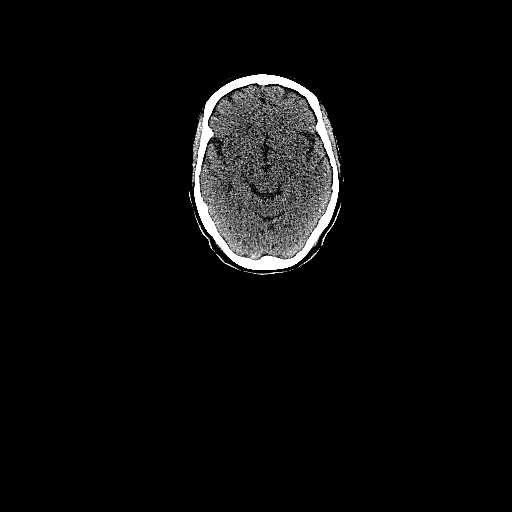
[im 161/188  brain]
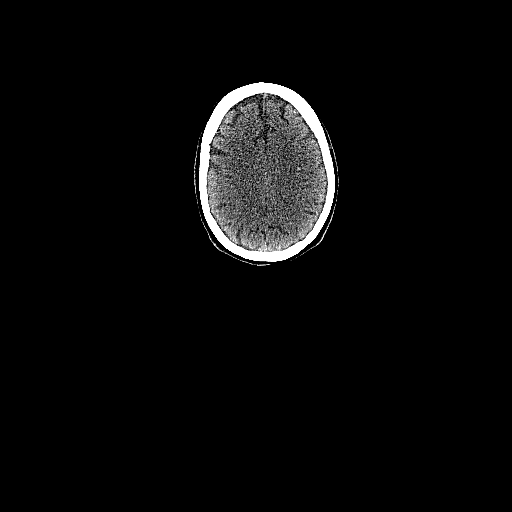

[2 of 25 positions shown; findings below may reference images not displayed]

FINDINGS: The thyroid gland is diffusely hypermetabolic without a focal mass. There
are hypermetabolic bilateral cervical lymph nodes. There are multiple
hypermetabolic lesions within the cervical spine.

There is no pulmonary mass.

The heart size is normal. There is no pericardial effusion.

There are hypermetabolic mediastinal lymph nodes with the largest precarinal
lymph node measuring 3.2 cm. There are hypermetabolic lesions right frontal
calvarium, right humerus, left distal clavicle, right first rib, right
glenoid, right humeral head, T3 vertebral body, left T5 transverse process,
6 right posterior rib, sternum sixth left posterior rib, left eighth
posterior rib, T10 vertebral body, T11 vertebral body, T12 right transverse
process and vertebral body, L1 vertebral body, left ilium, L5 vertebral
body, right ilium, sacrum, right acetabulum, left acetabulum, right femur,
left femur and multiple hypermetabolic hepatic masses. There is a
hypermetabolic within the left adrenal mass.

These masses measure an average SUV max of 15 and an SUV average of 5.7 .
IMPRESSION: 1. Innumerable hypermetabolic foci involving the head, neck, chest, abdomen
and pelvis as well as the axial and appendicular skeleton most consistent
with metastatic disease. Tissue diagnosis recommended.

2. Diffusely hypermetabolic thyroid gland as can be seen with thyroiditis.
Correlate with laboratory values.

[REDACTED]

## 2014-04-17 IMAGING — CT CT SIM MISC
1 series · 16 of 34 positions shown, 20 images · non-contrast
Comparison: none

[Series 2: — · axial · 1.17mm/px · z∈[-780,-368]mm · 16 of 154 slices shown, 20 images]
[im 12/154  mediastinal]
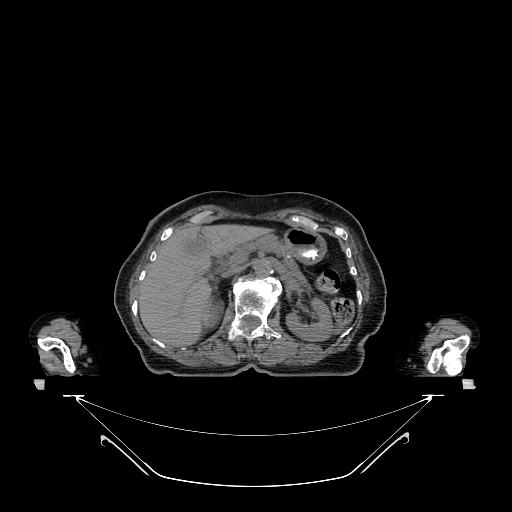
[im 12/154  lung]
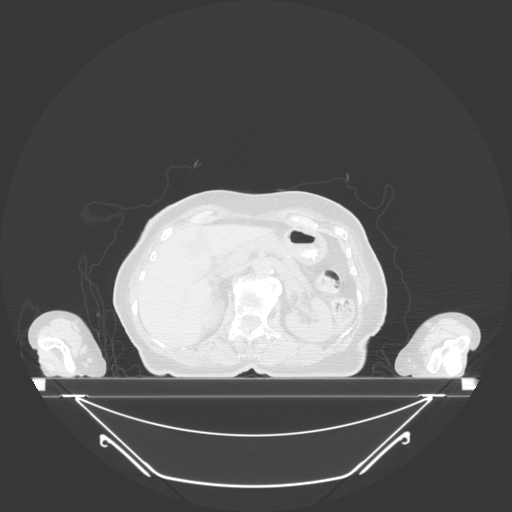
[im 23/154  lung]
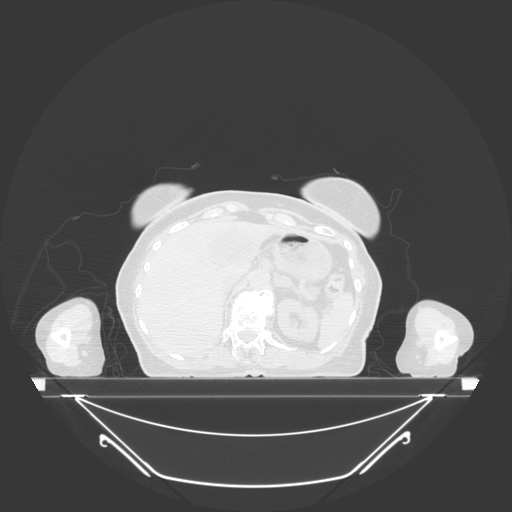
[im 31/154  lung]
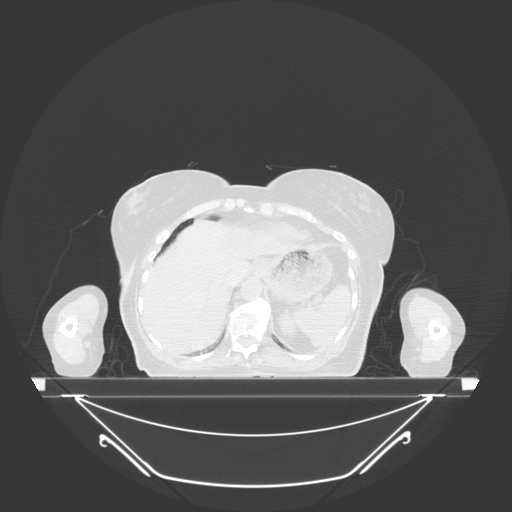
[im 40/154  lung]
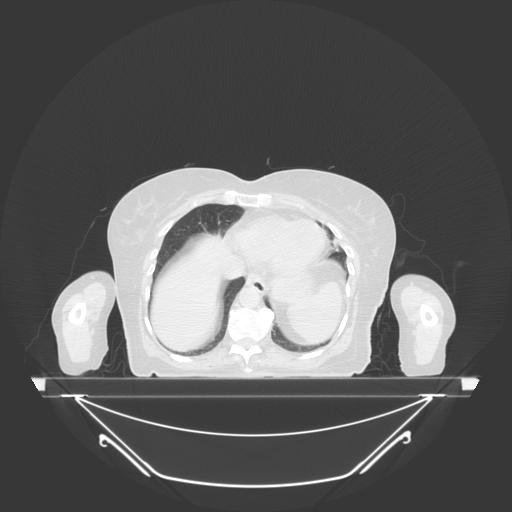
[im 52/154  mediastinal]
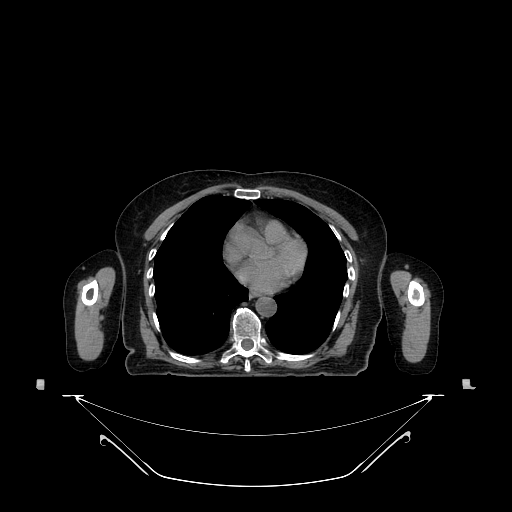
[im 52/154  lung]
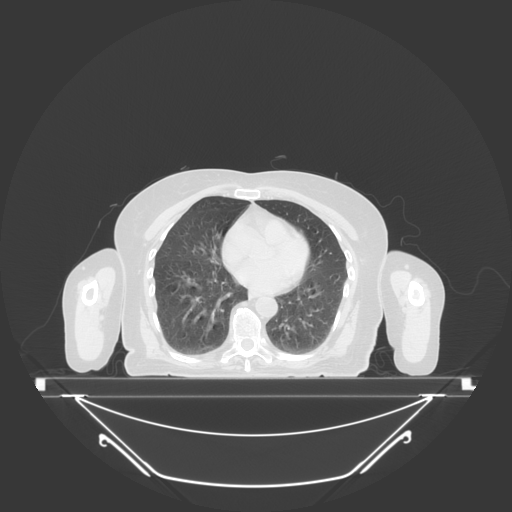
[im 62/154  lung]
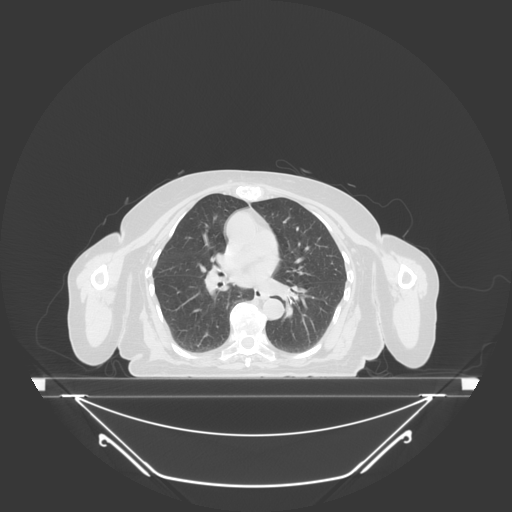
[im 69/154  lung]
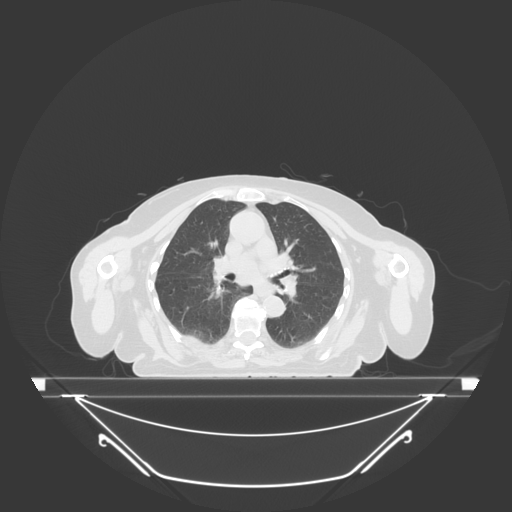
[im 75/154  lung]
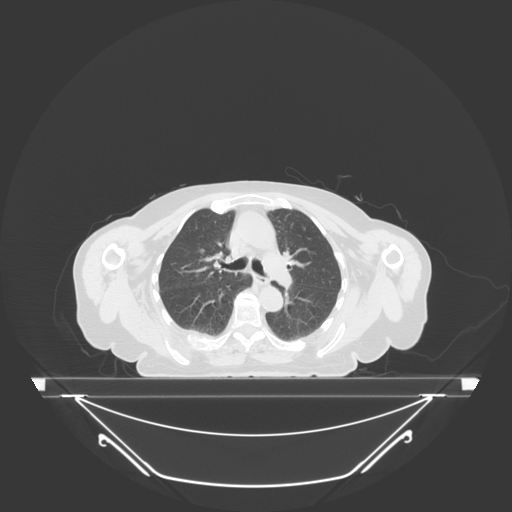
[im 81/154  mediastinal]
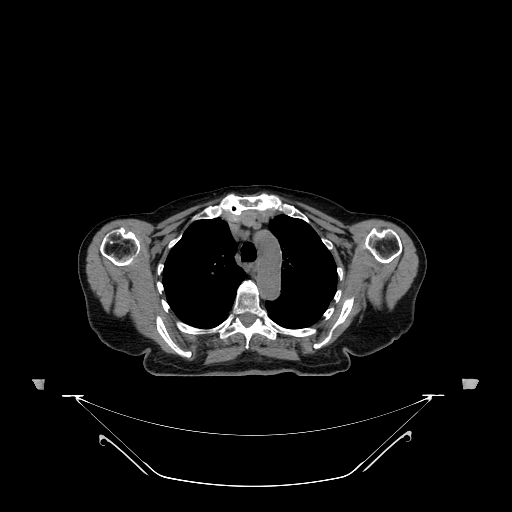
[im 81/154  lung]
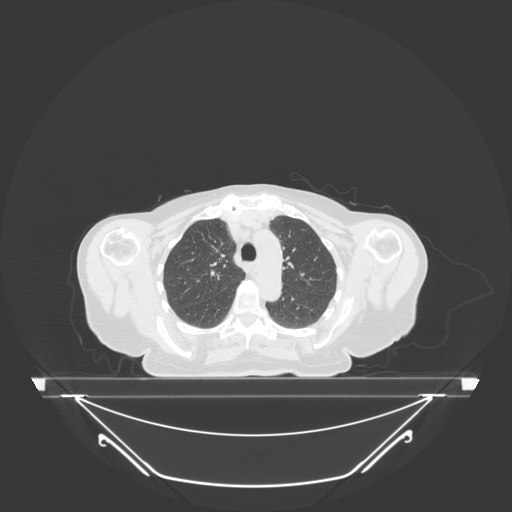
[im 91/154  lung]
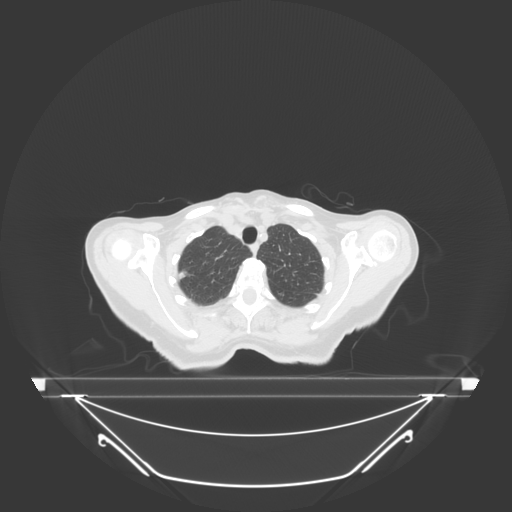
[im 97/154  lung]
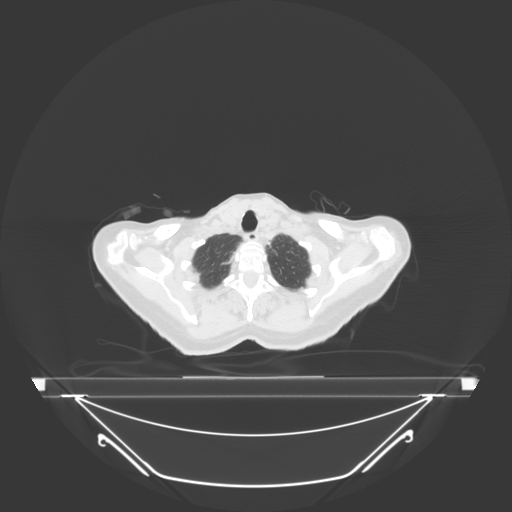
[im 108/154  lung]
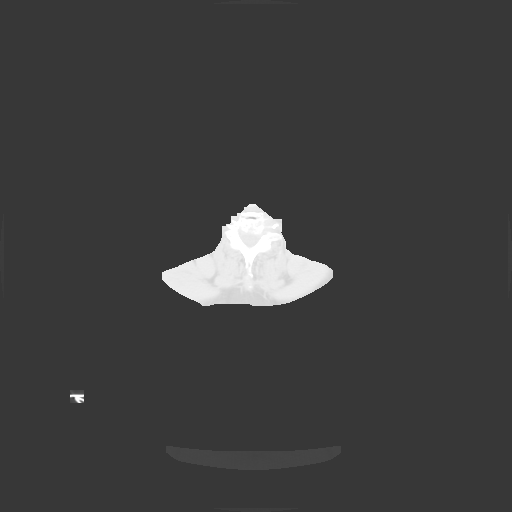
[im 120/154  mediastinal]
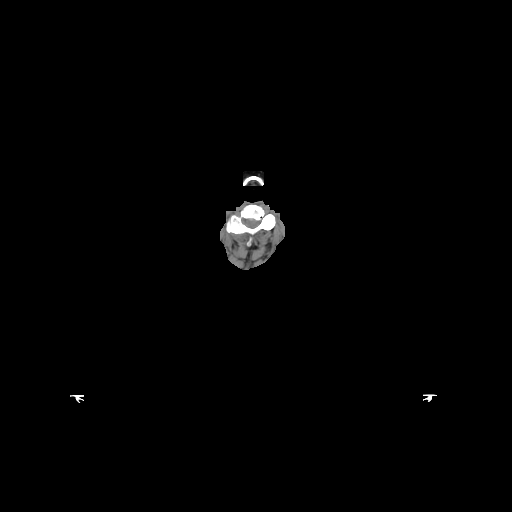
[im 120/154  lung]
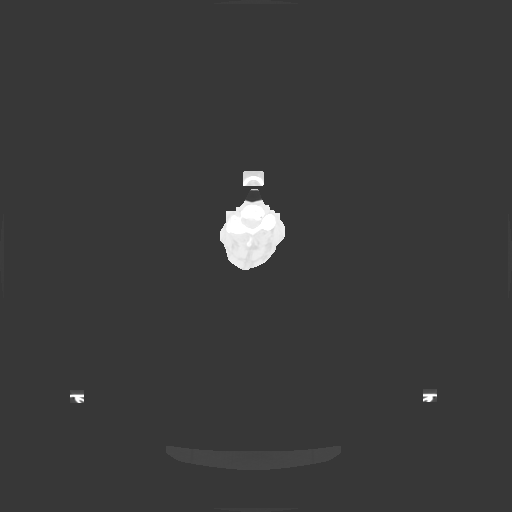
[im 125/154  lung]
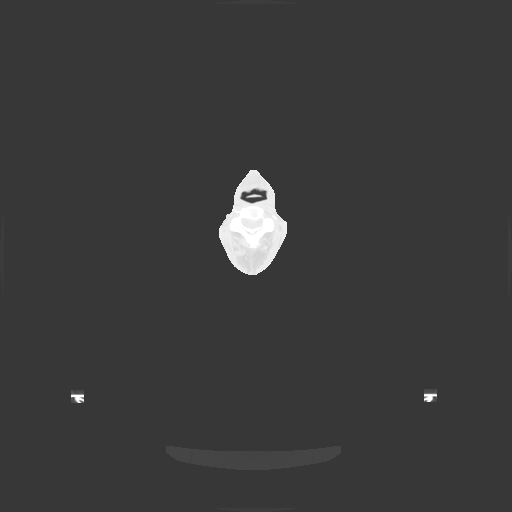
[im 137/154  lung]
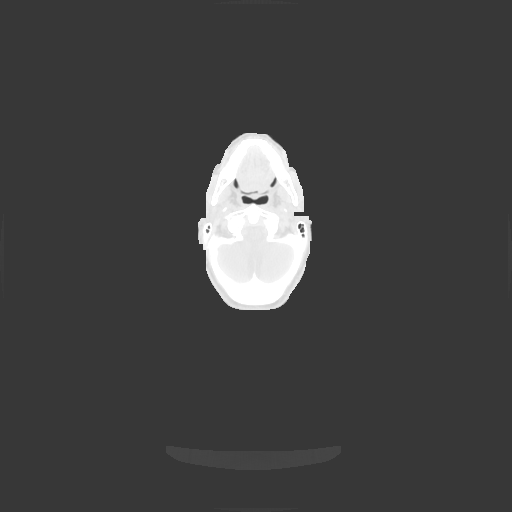
[im 148/154  lung]
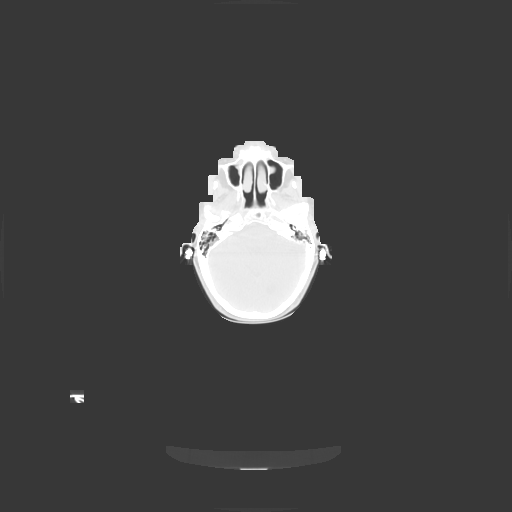

[16 of 34 positions shown; findings below may reference images not displayed]

IMAGES IMPORTED FROM THE [employer] WORKFLOW SYSTEM
NO DICTATION FOR STUDY

## 2015-01-07 NOTE — Consult Note (Signed)
PATIENT NAME:  [name], [name] MR#:  [id number] DATE OF BIRTH:  04/30/1926  DATE OF CONSULTATION:  01/27/2013  REFERRING PHYSICIAN:   CONSULTING PHYSICIAN:  Christopher A. Lundquist, MD  HISTORY OF PRESENT ILLNESS: [name] is a pleasant unfortunate 81-year-old female who was recently diagnosed last April with metastatic lung cancer to the of bowel and liver. She has been undergoing chemotherapy under the care of Dr. Choksi. She presents here for 1 week of worsening jaundice and worsening appetite and decreased energy. According to her, she has been undergoing intermittent chemotherapy and radiation for her metastatic lung cancer and, according to her family, became increasingly jaundiced over the last week. She also has felt constipated and has not been eating very much and her energy is down. Otherwise, no abdominal pain. No chest pain, shortness of breath, cough, fevers, chills, night sweats, nausea, vomiting, diarrhea, dysuria or hematuria.   PAST MEDICAL HISTORY:   As follows: 1.  Metastatic lung cancer to liver and bone for which she is treated by Dr. Choksi.  2.  History of arthritis.  3.  History cataracts.  4.  History of spinal stenosis.  5.  History of hypertension.  6.  Hypothyroidism.   PAST SURGICAL HISTORY:  1.  Cesarean section x 2.  2.  Back surgery in 2012.  3.  History of tonsillectomy and adenoidectomy.  4.  History of appendectomy.  5.  History of hysterectomy.   ALLERGIES: No known drug allergies.   HOME MEDICATIONS:  As follows:   1.  Synthroid.  2.  Spectravite vitamin. 3.  MiraLax.  4.  Lisinopril.  5.  Hydrochlorothiazide.  6.  Colace.  7.  Calcium.   FAMILY HISTORY:  1.  A brother with hypertension.  2.  History of father with tobacco use.  3.  Family history of leukemia.  4.  Family history of diabetes and hypertension.   SOCIAL HISTORY: Denies alcohol or tobacco, is here with her daughters.   REVIEW OF SYSTEMS:  A 12-point review of systems  was obtained. Pertinent positives and negatives.   PHYSICAL EXAMINATION: VITAL SIGNS:  Temperature 98.4, pulse 93, blood pressure 116/71, respirations 18, 98% on room air.  GENERAL: No acute distress. Alert and oriented x 3.  HEAD: Normocephalic, atraumatic.  EYES: Plus scleral icterus and no conjunctivitis.  FACE: Obvious jaundice, no obvious facial trauma. Normal external nose. Normal external ears.  CHEST: Lungs clear to auscultation, moving air well.  HEART: Regular rate and rhythm. No murmurs, rubs or gallops.  ABDOMEN: Soft, nontender, nondistended.  EXTREMITIES: Moves all extremities well. Strength 5 out of 5.  NEUROLOGICAL:  Sensation intact in all 4 extremities. Cranial nerves II through XII grossly intact.   LABORATORY, DIAGNOSTIC AND RADIOLOGICAL DATA: Currently bilirubin is 16.2, alk phos is 324, AST is 75, ALT is 107. Serum sodium is 109, creatinine is normal, chloride is 75. White cell count is currently 8.5 with an 84% neutrophil predominance. CT scan shows some pericholecystic fluid and gallbladder stranding, does have intrahepatic biliary dilatation and  gallbladder does contain stones. Does have a large left-sided hepatic mass, also per read right hepatic artery may be thrombosed.   ASSESSMENT AND PLAN: [name] is a pleasant 81-year-old female with 1 week of jaundice and a history of metastatic lung cancer for which she is being treated. Although her gallbladder is distended, she does not have any pain associated with palpation and, therefore, I do not think that she warrants cholecystectomy. In addition, as she   has terminal cancer, if complications were to arise from her surgery I do not think that it would be in her best interest to undergo surgery even if it was indicated. Would recommend IV antibiotics and possible percutaneous drainage if cholecystectomy would be sought; however, I believe her jaundice could be due to 1 of 2 things. One could be obstructive jaundice due to a  stone or a mass or possibly a jaundice due to a hepatic vein or hepatic artery thrombosis. I have asked for gastroenterology assistance and, as I understand, the plan of care has also been assistance.  Will continue to follow with you.   ____________________________ Glena Norfolk. Khaleelah Yowell, MD cal:cs D: 01/27/2013 19:52:49 ET T: 01/27/2013 20:18:02 ET JOB#: [id number]  cc: Harrell Gave A. Lanisha Stepanian, MD, <Dictator> Floyde Parkins MD ELECTRONICALLY SIGNED 01/28/2013 15:39

## 2015-01-07 NOTE — Consult Note (Signed)
Brief Consult Note: Diagnosis: jaundice.   Patient was seen by consultant.   Consult note dictated.   Discussed with Attending MD.   Comments: Ms. Rolan LipaCoward is a pleasant 81 y/o female with hx stage IV adenocarcinoma of lung with metastatic disease to liver, bone & lymph nodes.  She reports 5-6 days of jaundice and malaise.  CT chest/abd/pelvis shows 2.8x2.9cm left hepatic mass, intrahepatic biliary dilation & filling defect, possible embolus, in right hepatic artery.  I discussed treatment options at this point with patient, Dr Servando SnareWohl & her 2 daughters at bedside which include ERCP with placement of right & left hepatic duct stents.  We discussed risks of procedure including pancreatitis, bleeding, perforation or medication reaction.  We discussed the progression of her disease & the option of hospice care only.  We discussed possible anticoagulation treatment of hepatic artery clot, risks, benefits, alternatives including hemorrhage, stroke & death.  At this point, patient would like to discuss with family.  They are leaning towards hospice care which is understandable.  I will see patient again in the morning & palliative care team is on board as well. #[id number].  Electronic Signatures: Joselyn ArrowJones, Trinaty Bundrick L (NP)  (Signed 13-May-14 19:04)  Authored: Brief Consult Note   Last Updated: 13-May-14 19:04 by Joselyn ArrowJones, Rogen Porte L (NP)

## 2015-01-07 NOTE — Consult Note (Signed)
PATIENT NAME:  [name], [name] MR#:  [id number] DATE OF BIRTH:  12/17/1925  DATE OF CONSULTATION:  01/27/2013  REFERRING PHYSICIAN:   CONSULTING PHYSICIAN:  Andria Meuse, NP  PRIMARY CARE PHYSICIAN: Dr. Ramonita Lab.  GASTROENTEROLOGIST: Dr. Lucilla Lame.   ONCOLOGIST: Dr. Oliva Bustard.   REASON FOR CONSULT: Jaundice.   HISTORY OF PRESENT ILLNESS: [name] is an 81 year old Caucasian female who was diagnosed with lung cancer (adenocarcinoma) last year. She has metastatic disease with mets to liver, bone and lymph nodes. She has been having treatment under the direction of oncologist, Dr. Oliva Bustard. Six days ago, she noticed that her skin was turning yellow and she was feeling quite fatigued. She had some nausea but this passed. She did have some very mild mid abdominal pain yesterday which has resolved. She denies any vomiting. She has had a 30-pound weight loss in the last couple of years. She had normal LFTs Jan 15, 2013. Her bilirubin is now 16.2, alkaline phosphatase 324, AST 75, ALT 107. Her CT scan of the abdomen, chest and pelvis with IV contrast shows a distended gallbladder with pericholecystic fluid, intrahepatic biliary ductal dilation, diffuse sclerotic osseous metastatic lesions, left hepatic lobe mass that is 2.9 x 2.8 cm and a filling defect in the right hepatic artery. Common bile duct was normal. She was admitted with hyponatremia, SIADH and jaundice.   PAST MEDICAL AND SURGICAL HISTORY:  1.  Stage IV adenocarcinoma of the lung diagnosed April 2013 with mets to bone, liver, lymph  nodes.  2.  Hypertension.  3.  Hypothyroidism.  4.  Osteoarthritis.  5.  Spinal stenosis.  6.  Status post appendectomy.  7.  Status post hysterectomy.   MEDICATIONS PRIOR TO ADMISSION: Calcium 600 mg with vitamin D t.i.d., Colace 50 mg b.i.d., hydrochlorothiazide 25 mg daily, lisinopril 40 mg daily, MiraLax 17 grams daily p.r.n., Spectravite senior oral tablets once daily, Synthroid 100 mcg daily.    ALLERGIES: No known drug allergies.   FAMILY HISTORY: There is no known family history of chronic liver disease or GI problems.   SOCIAL HISTORY: She is a widow. She lives alone. She has 2 healthy daughters. She is a retired Pharmacist, hospital. She denies any tobacco, alcohol or drug use.   REVIEW OF SYSTEMS: See HPI, otherwise negative review of systems.   PHYSICAL EXAM:  VITAL SIGNS: Temp 98.4, pulse 93, respirations 18, blood pressure 116/71, O2 sat 98% on room air.  GENERAL: She is an elderly Caucasian female who is jaundiced. She has both daughters at her bedside.  HEENT: Scleral icterus. Conjunctivae pale. Oropharynx pink and moist without any lesions. NECK: Supple without mass or thyromegaly.  CHEST: Heart regular rate and rhythm. Normal S1 and S2 without murmurs clicks, rubs or gallops.  LUNGS: Clear to auscultation bilaterally.  ABDOMEN: Positive bowel sounds x 4. Abdomen is mildly distended. No rebound tenderness or guarding. No hepatosplenomegaly noted. Exam limited given the patient's body habitus. EXTREMITIES: Without edema bilaterally.  SKIN: Jaundice, multiple ecchymoses to upper extremities.  PSYCH: She is alert, oriented and cooperative.   LABORATORY STUDIES: Sodium 109, creatinine 0.49, chloride 75, calcium 7.1, otherwise normal Met-7. Total protein 4.5, albumin 2, bilirubin 16.4, alkaline phosphatase 324, AST 75, ALT 107. White blood cell count 8.5, hemoglobin 11.3, hematocrit 31.2, platelets 153, haptoglobin 100.   IMAGING: See HPI.  IMPRESSION: [name] is a very pleasant 81 year old female with history of stage IV adenocarcinoma of the lung with metastatic disease to liver, bone and lymph nodes. She  reports a 5 to 6-day history of jaundice and malaise. CT of the chest, abdomen and pelvis showed 2.8 x 2.9 cm left hepatic mass, intrahepatic biliary dilation and filling defect, possible embolus in the right hepatic artery. I discussed treatment options at this point with the  patient, Dr. Allen Norris and her 2 daughters at the bedside, which include ERCP with placement of right and left hepatic duct stents. We discussed risks of procedure including pancreatitis, bleeding, perforation or medication reaction. We discussed the progression of her disease and the option of hospice care only. We discussed possible anticoagulation treatment of hepatic artery embolus, risks, benefits and alternatives including hemorrhage, stroke and death. At this point, patient would like to discuss with family. They are leaning towards hospice care only, which is understandable. I will see the patient again in the morning  and the palliative care team is on board as well.   PLAN: 1.  Supportive measures.  2.  Agree with IV fluids and saline repletion.  3.  Consider referral to hospice care.   ____________________________ Andria Meuse, NP klj:cs D: 01/27/2013 19:04:00 ET T: 01/27/2013 19:37:39 ET JOB#: [id number]  cc: Andria Meuse, NP, <Dictator> Adin Hector, MD Andria Meuse FNP ELECTRONICALLY SIGNED 01/30/2013 11:38

## 2015-01-09 NOTE — Consult Note (Signed)
Reason for Visit: This [age] year old Female patient presents to the clinic for initial evaluation of  Bone metastases from lung cancer .   Referred by Dr. Doylene Canninghoksi.  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 She underwent a CT-guided biopsy which was positive for adenocarcinoma of one of these nodular densities in her neck. She underwent a PET/CT scan which showed widespread metastatic disease in both her liver bones and neck. She actually has been quite clinically stable. Her major complaint is pain in her upper arms. She does have on PET/CT marked destruction of the T3 vertebral body with avid uptake in that region. I been asked to evaluate the patient for palliative radiation therapy to the upper thoracic spine. She is going on vacation in 2 weeks in his asked me to complete her treatments by that time. She will also when she gets back from vacation be starting systemic chemotherapy under Dr. Aleda Granahoksi's direction.  Past Hx:    Arthritis: in back   catarac- right eye:    spinal stenosis: Jan 2012   Cancer, Adenocarcinoma: neck, May 2013   Hypertension:    Hypothyroidism:    Cesarean Section: times 2   back surgery: Jan 2012   Tonsillectomy and Adenoidectomy:    Appendectomy:    Hysterectomy - Total:   Past, Family and Social History:   Past Medical History positive    Cardiovascular hypertension    Endocrine hypothyroidism    Past Surgical History appendectomy; Back surgery, hysterectomy, tonsillectomy,  cesarean section    Past Medical History Comments Spinal stenosis, cataract right eye, arthritis    Family History positive    Family History Comments Family history of leukemia, family history of adult onset diabetes    Social History noncontributory    Additional Past Medical and Surgical History Accompanied by her daughter today   Allergies:   No Known Allergies:   Home Meds:  Home Medications: Medication Instructions Status  Calcium 600 +D   2 times a day Active  refresh tears  Active  Symbicort  Active  hydrochlorothiazide 25 mg oral tablet 1  orally   Active  Synthroid 100 mcg (0.1 mg) oral tablet 1  orally once a day  Active  acetaminophen-hydrocodone 325 mg-5 mg oral tablet 1 tab(s) orally every 6 hours, As Needed- for Pain  Active  lisinopril 40 mg oral tablet 1 tab(s) orally once a day Active  Spectravite Senior oral tablet 1 tab(s) orally once a day Active   Review of Systems:   General negative    Performance Status (ECOG) 0    Skin negative    Breast negative    Ophthalmologic negative    ENMT negative    Respiratory and Thorax negative    Cardiovascular negative    Gastrointestinal negative    Genitourinary negative    Musculoskeletal negative    Neurological negative    Psychiatric negative    Hematology/Lymphatics negative    Endocrine negative    Allergic/Immunologic negative   Nursing Notes:  Nursing Vital Signs and Chemo Nursing Nursing Notes: *CC Vital Signs Flowsheet:   30-May-13 09:38   Temp Temperature 96.8   Pulse Pulse 88   Respirations Respirations 16   SBP SBP 146   DBP DBP 64   Pain Scale (0-10)  6   Current Weight (kg) (kg) 67.6   Height (cm) centimeters 161   BSA (m2) 1.7   Physical Exam:  General/Skin/HEENT:   General normal    Skin normal    Eyes normal    ENMT normal    Head and Neck normal    Additional PE Well-developed female in NAD. She does have bilateral adenopathy in her neck. Motor sensory  and DTR levels are equal and symmetric in the upper or lower extremities. Lungs are clear to A&P cardiac examination shows regular rate and rhythm. Abdomen is benign. No area of point tenderness is noted on deep palpation of her spine. No sensory or motor levels are appreciated.   Breasts/Resp/CV/GI/GU:   Respiratory and Thorax normal    Cardiovascular normal    Gastrointestinal normal    Genitourinary normal   MS/Neuro/Psych/Lymph:   Musculoskeletal normal    Neurological normal    Lymphatics normal   Other Results:  Radiology Results: CT:    16-Apr-13 09:24, CT Neck With Contrast   CT Neck With Contrast    REASON FOR EXAM: US done 12-21-11 internal blood flow in area of rt neck   mass will have labs'85.  COMMENTS:    PROCEDURE: CT NECK WITH CONTRAST 01/01/2012 9:24 AM    RESULT:      Technique: Helical 3 mm sections were obtained from the skull base   through the mid mediastinal region status post intravenous administration   of 65 mL of Isovue-300.    Findings: Three to 5 cm sized lymph nodes are identified within the   anterior and posterior cervical chains primarily within the carotid space   regions.  Along the posterior aspect of the parotid space a 1.31 x 1.17 cm nodule   is appreciated with a central area of low-attenuation. This may represent   a lymph node though a non-nodal mass is a diagnostic consideration. The   low attenuating centeris concerning for area of necrosis. A prominent   lymph node is identified along the anterior base of the lateral strap   muscle on the right measured on image #67 demonstrating narrowest   dimension of 1.01 cm.    The mediastinum and hilar regions and structures demonstrates a   heterogeneously enhancing mass within the subcarinal region measuring   2.67 x 5.19 cm. Differential considerations are a nodal mass versus   non-nodal soft tissue mass with central low attenuation possibly   representing necrosis. Bilateral hilar  lymph nodes are identified which   are enlarged. Mild to moderately enlarged precarinal and pretracheal   lymph nodes are appreciated. These areas measure approximately 1 cm in     narrowest dimensions. There are findings concerning for prominent right   hilar lymph nodes. The visualized upper lung parenchyma demonstrates   thickening of the interstitial markings as well as diffuse ground-glass   appearance.    IMPRESSION:     1. Adenopathy within the neck. As describedabove, there is a necrotic   lymph node versus necrotic non-nodal mass just posterior and inferior to   the parotid space region on the left.  2. Mediastinal mass as described above. Different considerations are   primary neoplastic disease versus metastatic disease  in the mediastinum.   An etiology such as lymphoma is of diagnostic consideration. There is   mediastinal adenopathy as well as findings concerning for hilar   adenopathy. These findings again are concerning for consistent with     neoplastic disease until proven otherwise. Note; the previously described   mediastinal mass does not demonstrate appreciable evidence of tracheal or   bronchial extension nor vascular extension or compression at this time.  3. Enlarged lymph node versus non-nodal mass along the posterior base of   the neck on the right. This finding was previously initially evaluated   with ultrasound. The CT findings are consistent with the sonographic   findings. This study was correlated with the ultrasound from4/01/2012.      Thank you for the opportunity to contribute to the care of your patient.           Verified By: Jani Files, M.D., MD  Nuclear Med:    01-May-13 11:12, PET/CT Scan Head/Neck CA Initial Staging   PET/CT Scan Head/Neck CA Initial Staging    REASON FOR EXAM:    positive FNA  unknown prim adenoma CA  COMMENTS:       PROCEDURE: PET - PET/CT INIT STAG HEAD/NECK CA  - Jan 16 2012 11:12AM     RESULT: Indication:  Primary adenocarcinoma of unknown origin  Radiopharmaceutical: 12.3 mCi F18-FDG, intravenously.    Technique: Imaging was performed from the skull base to the mid-thigh   using routine PET/CT acquisition protocol.    Injection site: Left antecubital  Time of FDG injection: 0913 hours  Serum glucose: 108 mg/dL   Time of imaging: 1610 hour with delayed images at 1053 hours  Comparison studies: NONE    Findings:    The thyroid gland is diffusely hypermetabolic without a focal mass. There   are hypermetabolic bilateral cervical lymph nodes. There are multiple   hypermetabolic lesions within the cervical spine.    There is no pulmonary mass.    The heart size is normal. There is no pericardial effusion.     There are hypermetabolic mediastinal lymph nodes with the largest   precarinal lymph node measuring 3.2 cm. There are hypermetabolic lesions   right frontal calvarium, right humerus, left distal clavicle, right first     rib, right glenoid, right humeral head, T3 vertebral body, left T5   transverse process, 6 right posterior rib, sternum sixth left posterior   rib, lefteighth posterior rib, T10 vertebral body, T11 vertebral body,   T12 right transverse process and vertebral body, L1 vertebral body, left   ilium, L5 vertebral body, right ilium, sacrum, right acetabulum, left   acetabulum, right femur, left femur andmultiple hypermetabolic hepatic   masses. There is a hypermetabolic within the left adrenal mass.    These masses measure an average SUV max of 15 and an SUV average of 5.7 .    IMPRESSION:     1. Innumerable hypermetabolic foci involving the head, neck, chest,   abdomen and pelvis as well as the axial and appendicular skeleton most   consistent with metastatic disease. Tissue diagnosis recommended.  2. Diffusely hypermetabolic thyroid gland as can be seen with   thyroiditis. Correlate with laboratory values.    Dictation Site: 1          Verified By: Joellyn Haff, M.D., MD   Assessment and Plan:  Impression:   quite extensive adenocarcinoma metastatic disease in patient with very little clinical symptoms except for upperlevel  back pain as well as pain throughout her shoulder region.  Plan:   at the stomach to start a course of palliative radiation therapy to her upper thoracic spine. We will treat her with 3000 cGy in 10 fractions. Based on her constraints of her upcoming vacation with her family will CT simulator today and start treatments 1st thing Monday. Risks and benefits of treatment were explained to the patient and her daughter including possible radiation esophagitis, fatigue and alteration of her blood counts. Patient was simulated today and will start her treatments Monday.  I would like to take this opportunity to thank you for allowing me to continue to participate in this patient's care.  CC Referral:   cc: Dr. Daniel Nones   Electronic Signatures: Rushie Chestnut, Gordy Councilman (MD)  (Signed [phone] 13:49)  Authored: HPI, Diagnosis, Past Hx, PFSH, Allergies, Home Meds, ROS, Nursing Notes, Physical Exam, Other Results, Encounter Assessment and Plan, CC Referring Physician   Last Updated: 30-May-13 13:49 by Rebeca Alert (MD)
